# Patient Record
Sex: Female | Born: 1955 | Race: Black or African American | Hispanic: No | Smoking: Current every day smoker
Health system: Southern US, Community
[De-identification: ages and names within clinical notes are randomized; demographics above are authoritative.]

## PROBLEM LIST (undated history)

## (undated) DIAGNOSIS — I10 Essential (primary) hypertension: Secondary | ICD-10-CM

## (undated) DIAGNOSIS — M1990 Unspecified osteoarthritis, unspecified site: Secondary | ICD-10-CM

## (undated) DIAGNOSIS — M797 Fibromyalgia: Secondary | ICD-10-CM

## (undated) HISTORY — DX: Essential (primary) hypertension: I10

## (undated) HISTORY — PX: TUBAL LIGATION: SHX77

---

## 2001-08-14 ENCOUNTER — Encounter: Payer: Self-pay | Admitting: Internal Medicine

## 2001-08-14 ENCOUNTER — Ambulatory Visit (HOSPITAL_COMMUNITY): Admission: RE | Admit: 2001-08-14 | Discharge: 2001-08-14 | Payer: Self-pay | Admitting: Internal Medicine

## 2001-09-21 ENCOUNTER — Emergency Department (HOSPITAL_COMMUNITY): Admission: EM | Admit: 2001-09-21 | Discharge: 2001-09-21 | Payer: Self-pay | Admitting: Emergency Medicine

## 2001-09-21 ENCOUNTER — Encounter: Payer: Self-pay | Admitting: Emergency Medicine

## 2001-09-24 ENCOUNTER — Ambulatory Visit (HOSPITAL_COMMUNITY): Admission: RE | Admit: 2001-09-24 | Discharge: 2001-09-24 | Payer: Self-pay | Admitting: Internal Medicine

## 2001-09-24 ENCOUNTER — Encounter: Payer: Self-pay | Admitting: Internal Medicine

## 2002-11-07 ENCOUNTER — Encounter: Payer: Self-pay | Admitting: Emergency Medicine

## 2002-11-07 ENCOUNTER — Emergency Department (HOSPITAL_COMMUNITY): Admission: EM | Admit: 2002-11-07 | Discharge: 2002-11-07 | Payer: Self-pay | Admitting: Emergency Medicine

## 2003-08-11 ENCOUNTER — Emergency Department (HOSPITAL_COMMUNITY): Admission: EM | Admit: 2003-08-11 | Discharge: 2003-08-12 | Payer: Self-pay | Admitting: Emergency Medicine

## 2006-08-04 ENCOUNTER — Emergency Department (HOSPITAL_COMMUNITY): Admission: EM | Admit: 2006-08-04 | Discharge: 2006-08-04 | Payer: Self-pay | Admitting: Emergency Medicine

## 2008-11-21 ENCOUNTER — Emergency Department (HOSPITAL_COMMUNITY): Admission: EM | Admit: 2008-11-21 | Discharge: 2008-11-21 | Payer: Self-pay | Admitting: Emergency Medicine

## 2009-02-03 ENCOUNTER — Emergency Department (HOSPITAL_COMMUNITY): Admission: EM | Admit: 2009-02-03 | Discharge: 2009-02-03 | Payer: Self-pay | Admitting: Emergency Medicine

## 2009-07-01 ENCOUNTER — Emergency Department (HOSPITAL_COMMUNITY): Admission: EM | Admit: 2009-07-01 | Discharge: 2009-07-01 | Payer: Self-pay | Admitting: Emergency Medicine

## 2010-05-26 ENCOUNTER — Emergency Department (HOSPITAL_COMMUNITY): Admission: EM | Admit: 2010-05-26 | Discharge: 2010-05-27 | Payer: Self-pay | Admitting: Emergency Medicine

## 2010-06-08 ENCOUNTER — Emergency Department (HOSPITAL_COMMUNITY): Admission: EM | Admit: 2010-06-08 | Discharge: 2010-06-09 | Payer: Self-pay | Admitting: Emergency Medicine

## 2011-03-04 LAB — CBC
Hemoglobin: 13.3 g/dL (ref 12.0–15.0)
MCH: 30.3 pg (ref 26.0–34.0)
MCHC: 33.7 g/dL (ref 30.0–36.0)
MCV: 89.9 fL (ref 78.0–100.0)
Platelets: 232 10*3/uL (ref 150–400)
RBC: 4.41 MIL/uL (ref 3.87–5.11)
RDW: 14.3 % (ref 11.5–15.5)

## 2011-03-04 LAB — DIFFERENTIAL
Basophils Absolute: 0.1 10*3/uL (ref 0.0–0.1)
Basophils Relative: 0 % (ref 0–1)
Eosinophils Relative: 2 % (ref 0–5)
Lymphocytes Relative: 13 % (ref 12–46)
Monocytes Absolute: 0.7 10*3/uL (ref 0.1–1.0)
Neutrophils Relative %: 80 % — ABNORMAL HIGH (ref 43–77)

## 2011-03-04 LAB — BASIC METABOLIC PANEL
CO2: 25 mEq/L (ref 19–32)
Creatinine, Ser: 0.64 mg/dL (ref 0.4–1.2)
GFR calc Af Amer: >60 mL/min (ref >60)
Glucose, Bld: 111 mg/dL — ABNORMAL HIGH (ref 70–99)
Potassium: 3.8 mEq/L (ref 3.5–5.1)
Sodium: 139 mEq/L (ref 135–145)

## 2011-03-04 LAB — URINALYSIS, ROUTINE W REFLEX MICROSCOPIC
Glucose, UA: NEGATIVE mg/dL
Nitrite: NEGATIVE
Specific Gravity, Urine: 1.01 (ref 1.005–1.030)

## 2011-03-04 LAB — GLUCOSE, CAPILLARY: Glucose-Capillary: 107 mg/dL — ABNORMAL HIGH (ref 70–99)

## 2011-03-04 LAB — URINE MICROSCOPIC-ADD ON

## 2011-03-16 ENCOUNTER — Emergency Department (HOSPITAL_COMMUNITY): Payer: No Typology Code available for payment source

## 2011-03-16 ENCOUNTER — Emergency Department (HOSPITAL_COMMUNITY)
Admission: EM | Admit: 2011-03-16 | Discharge: 2011-03-16 | Disposition: A | Discharge status: Home / Self Care | Payer: No Typology Code available for payment source | Attending: Emergency Medicine | Admitting: Emergency Medicine

## 2011-03-16 DIAGNOSIS — T07XXXA Unspecified multiple injuries, initial encounter: Secondary | ICD-10-CM | POA: Insufficient documentation

## 2011-03-16 DIAGNOSIS — Y92410 Unspecified street and highway as the place of occurrence of the external cause: Secondary | ICD-10-CM | POA: Insufficient documentation

## 2011-03-16 LAB — BASIC METABOLIC PANEL
BUN: 14 mg/dL (ref 6–23)
CO2: 24 mEq/L (ref 19–32)
GFR calc Af Amer: >60 mL/min (ref >60)
GFR calc non Af Amer: >60 mL/min (ref >60)
Glucose, Bld: 98 mg/dL (ref 70–99)
Potassium: 4.3 mEq/L (ref 3.5–5.1)

## 2011-03-16 LAB — URINALYSIS, ROUTINE W REFLEX MICROSCOPIC
Ketones, ur: NEGATIVE mg/dL
Leukocytes, UA: NEGATIVE
Nitrite: NEGATIVE
Urobilinogen, UA: 0.2 mg/dL (ref 0.0–1.0)

## 2011-03-16 LAB — URINE MICROSCOPIC-ADD ON

## 2011-03-16 MED ORDER — IOHEXOL 300 MG/ML  SOLN
100.0000 mL | Freq: Once | INTRAMUSCULAR | Status: AC | PRN
  Administered 2011-03-16: 100 mL via INTRAVENOUS

## 2011-03-23 ENCOUNTER — Emergency Department (HOSPITAL_COMMUNITY)
Admission: EM | Admit: 2011-03-23 | Discharge: 2011-03-23 | Disposition: A | Discharge status: Home / Self Care | Payer: No Typology Code available for payment source | Attending: Emergency Medicine | Admitting: Emergency Medicine

## 2011-03-23 DIAGNOSIS — M545 Low back pain, unspecified: Secondary | ICD-10-CM | POA: Insufficient documentation

## 2011-03-23 DIAGNOSIS — M546 Pain in thoracic spine: Secondary | ICD-10-CM | POA: Insufficient documentation

## 2011-03-23 DIAGNOSIS — M542 Cervicalgia: Secondary | ICD-10-CM | POA: Insufficient documentation

## 2011-03-23 DIAGNOSIS — S335XXA Sprain of ligaments of lumbar spine, initial encounter: Secondary | ICD-10-CM | POA: Insufficient documentation

## 2011-03-25 LAB — POCT I-STAT, CHEM 8
Creatinine, Ser: 0.9 mg/dL (ref 0.4–1.2)
Glucose, Bld: 99 mg/dL (ref 70–99)
Sodium: 141 mEq/L (ref 135–145)
TCO2: 27 mmol/L (ref 0–100)

## 2011-03-25 LAB — CBC
Hemoglobin: 13.9 g/dL (ref 12.0–15.0)
Platelets: 273 10*3/uL (ref 150–400)
WBC: 12.5 10*3/uL — ABNORMAL HIGH (ref 4.0–10.5)

## 2011-04-03 LAB — URINALYSIS, ROUTINE W REFLEX MICROSCOPIC
Glucose, UA: NEGATIVE mg/dL
Ketones, ur: NEGATIVE mg/dL
Leukocytes, UA: NEGATIVE
Nitrite: NEGATIVE
Protein, ur: NEGATIVE mg/dL
pH: 6 (ref 5.0–8.0)

## 2011-04-03 LAB — URINE MICROSCOPIC-ADD ON

## 2011-04-03 LAB — DIFFERENTIAL
Eosinophils Relative: 2 % (ref 0–5)
Monocytes Absolute: 0.6 10*3/uL (ref 0.1–1.0)

## 2011-04-03 LAB — CBC
MCHC: 33.1 g/dL (ref 30.0–36.0)
Platelets: 271 10*3/uL (ref 150–400)
RDW: 14.1 % (ref 11.5–15.5)
WBC: 11.3 10*3/uL — ABNORMAL HIGH (ref 4.0–10.5)

## 2011-04-03 LAB — BASIC METABOLIC PANEL
BUN: 19 mg/dL (ref 6–23)
CO2: 29 mEq/L (ref 19–32)
Calcium: 9.4 mg/dL (ref 8.4–10.5)
Creatinine, Ser: 0.78 mg/dL (ref 0.4–1.2)

## 2011-09-21 LAB — URINALYSIS, ROUTINE W REFLEX MICROSCOPIC
Glucose, UA: NEGATIVE mg/dL
Leukocytes, UA: NEGATIVE
pH: 5.5 (ref 5.0–8.0)

## 2011-09-21 LAB — BASIC METABOLIC PANEL
GFR calc non Af Amer: >60 mL/min (ref >60)
Potassium: 2.9 mEq/L — ABNORMAL LOW (ref 3.5–5.1)
Sodium: 140 mEq/L (ref 135–145)

## 2011-09-21 LAB — URINE MICROSCOPIC-ADD ON

## 2011-09-21 LAB — CBC
HCT: 40.9 % (ref 36.0–46.0)
Hemoglobin: 13.6 g/dL (ref 12.0–15.0)
MCV: 90.2 fL (ref 78.0–100.0)
RBC: 4.53 MIL/uL (ref 3.87–5.11)

## 2011-09-21 LAB — DIFFERENTIAL
Eosinophils Relative: 0 % (ref 0–5)
Lymphocytes Relative: 9 % — ABNORMAL LOW (ref 12–46)
Lymphs Abs: 1.3 10*3/uL (ref 0.7–4.0)
Monocytes Absolute: 0.2 10*3/uL (ref 0.1–1.0)
Monocytes Relative: 1 % — ABNORMAL LOW (ref 3–12)

## 2011-09-21 LAB — GLUCOSE, CAPILLARY: Glucose-Capillary: 133 mg/dL — ABNORMAL HIGH (ref 70–99)

## 2011-09-21 LAB — POCT CARDIAC MARKERS
CKMB, poc: 1.2 ng/mL (ref 1.0–8.0)
Myoglobin, poc: 36.5 ng/mL (ref 12–200)

## 2011-09-21 LAB — RAPID URINE DRUG SCREEN, HOSP PERFORMED
Barbiturates: NOT DETECTED
Benzodiazepines: NOT DETECTED

## 2011-09-21 LAB — ETHANOL: Alcohol, Ethyl (B): <5 mg/dL (ref 0–10)

## 2012-07-23 ENCOUNTER — Emergency Department (HOSPITAL_COMMUNITY)
Admission: EM | Admit: 2012-07-23 | Discharge: 2012-07-23 | Disposition: A | Discharge status: Home / Self Care | Payer: Self-pay | Attending: Emergency Medicine | Admitting: Emergency Medicine

## 2012-07-23 ENCOUNTER — Encounter (HOSPITAL_COMMUNITY): Payer: Self-pay

## 2012-07-23 DIAGNOSIS — R51 Headache: Secondary | ICD-10-CM | POA: Insufficient documentation

## 2012-07-23 DIAGNOSIS — H6002 Abscess of left external ear: Secondary | ICD-10-CM

## 2012-07-23 DIAGNOSIS — H60399 Other infective otitis externa, unspecified ear: Secondary | ICD-10-CM | POA: Insufficient documentation

## 2012-07-23 DIAGNOSIS — F17200 Nicotine dependence, unspecified, uncomplicated: Secondary | ICD-10-CM | POA: Insufficient documentation

## 2012-07-23 MED ORDER — NAPROXEN 500 MG PO TABS: 500.0000 mg | ORAL_TABLET | Freq: Two times a day (BID) | ORAL | Status: AC

## 2012-07-23 MED ORDER — LIDOCAINE HCL (PF) 1 % IJ SOLN
INTRAMUSCULAR | Status: AC
  Administered 2012-07-23: 02:00:00
  Filled 2012-07-23: qty 5

## 2012-07-23 NOTE — ED Provider Notes (Signed)
History     CSN: [id number]  Arrival date & time 07/23/12  0144   First MD Initiated Contact with Patient 07/23/12 [phone]      Chief Complaint  Patient presents with  . Headache  . Recurrent Skin Infections    (Consider location/radiation/quality/duration/timing/severity/associated sxs/prior treatment) HPI Comments: 56 year old female who has a history of recurrent abscess in her left earlobe after getting an infection from her earrings. She states this is intermittent, often will go away by itself over the last several days has gotten worse and is becoming painful and causing a left-sided headache. Worse with palpation, not associated with fevers chills nausea or vomiting.  Patient is a 56 y.o. female presenting with headaches. The history is provided by the patient.  Headache  Pertinent negatives include no fever.    History reviewed. No pertinent past medical history.  History reviewed. No pertinent past surgical history.  No family history on file.  History  Substance Use Topics  . Smoking status: Current Everyday Smoker  . Smokeless tobacco: Not on file  . Alcohol Use: No    OB History    Grav Para Term Preterm Abortions TAB SAB Ect Mult Living                  Review of Systems  Constitutional: Negative for fever.  Skin: Positive for rash.  Neurological: Positive for headaches.    Allergies  Aspirin  Home Medications   Current Outpatient Rx  Name Route Sig Dispense Refill  . NAPROXEN 500 MG PO TABS Oral Take 1 tablet (500 mg total) by mouth 2 (two) times daily with a meal. 30 tablet 0    BP 164/104  Pulse 96  Temp 97.5 F (36.4 C)  Resp 16  Ht 5\' 4"  (1.626 m)  Wt 140 lb (63.504 kg)  BMI 24.03 kg/m2  SpO2 100%  Physical Exam  Nursing note and vitals reviewed. Constitutional: She appears well-developed and well-nourished. No distress.  HENT:  Head: Normocephalic and atraumatic.       Left earlobe with abscess, see skin section  Eyes:  Conjunctivae are normal. Right eye exhibits no discharge. Left eye exhibits no discharge. No scleral icterus.  Neck:       No lymphadenopathy of the neck  Cardiovascular: Normal rate and regular rhythm.   No murmur heard. Pulmonary/Chest: Effort normal and breath sounds normal.  Neurological: She is alert. Coordination normal.       No tenderness to the left side of the head, normal speech, normal coordination, normal gait.  Skin: Skin is warm and dry. She is not diaphoretic.       0.75 cm abscess to the left earlobe around the earring hole. Tender, fluctuant, no surrounding erythema    ED Course  Procedures (including critical care time)  Labs Reviewed - No data to display No results found.   1. Abscess of left external ear   2. Headache       MDM  Abscess drained, patient declines medicines for headache and has a normal neurologic exam, encouraged to perform warm soaks, anti-inflammatories, no indication for antibiotics.  INCISION AND DRAINAGE Performed by: Eber Hong D Consent: Verbal consent obtained. Risks and benefits: risks, benefits and alternatives were discussed Type: abscess  Body area: Left earlobe  Anesthesia: local infiltration  Local anesthetic: lidocaine 1 % without epinephrine  Anesthetic total: 0.5 ml  Complexity: Simple   Drainage: purulent  Drainage amount: Small   Packing material: None   Patient tolerance:  Patient tolerated the procedure well with no immediate complications.   Discharge Prescriptions include:  Naprosyn   Vida Roller, MD 07/23/12 [phone]

## 2012-07-23 NOTE — ED Notes (Signed)
Pt states she has a boil on the left ear and a headache, has been unable to sleep

## 2013-09-28 ENCOUNTER — Encounter (HOSPITAL_COMMUNITY): Payer: Self-pay | Admitting: Emergency Medicine

## 2013-09-28 ENCOUNTER — Emergency Department (HOSPITAL_COMMUNITY)
Admission: EM | Admit: 2013-09-28 | Discharge: 2013-09-28 | Disposition: A | Discharge status: Home / Self Care | Payer: Self-pay | Attending: Emergency Medicine | Admitting: Emergency Medicine

## 2013-09-28 DIAGNOSIS — M545 Low back pain, unspecified: Secondary | ICD-10-CM | POA: Insufficient documentation

## 2013-09-28 DIAGNOSIS — R109 Unspecified abdominal pain: Secondary | ICD-10-CM | POA: Insufficient documentation

## 2013-09-28 DIAGNOSIS — M79609 Pain in unspecified limb: Secondary | ICD-10-CM | POA: Insufficient documentation

## 2013-09-28 DIAGNOSIS — Z791 Long term (current) use of non-steroidal anti-inflammatories (NSAID): Secondary | ICD-10-CM | POA: Insufficient documentation

## 2013-09-28 DIAGNOSIS — M549 Dorsalgia, unspecified: Secondary | ICD-10-CM

## 2013-09-28 DIAGNOSIS — F17200 Nicotine dependence, unspecified, uncomplicated: Secondary | ICD-10-CM | POA: Insufficient documentation

## 2013-09-28 LAB — URINALYSIS, ROUTINE W REFLEX MICROSCOPIC
Glucose, UA: NEGATIVE mg/dL
Nitrite: NEGATIVE
Protein, ur: NEGATIVE mg/dL
Urobilinogen, UA: 0.2 mg/dL (ref 0.0–1.0)

## 2013-09-28 LAB — URINE MICROSCOPIC-ADD ON

## 2013-09-28 MED ORDER — PREDNISONE 10 MG PO TABS: 60.0000 mg | ORAL_TABLET | Freq: Every day | ORAL | Status: DC

## 2013-09-28 MED ORDER — HYDROCODONE-ACETAMINOPHEN 5-325 MG PO TABS
1.0000 | ORAL_TABLET | Freq: Once | ORAL | Status: AC
  Administered 2013-09-28: 1 via ORAL
  Filled 2013-09-28: qty 1

## 2013-09-28 MED ORDER — IBUPROFEN 800 MG PO TABS: 800.0000 mg | ORAL_TABLET | Freq: Three times a day (TID) | ORAL | Status: DC

## 2013-09-28 NOTE — ED Provider Notes (Signed)
CSN: [id number]     Arrival date & time 09/28/13  1644 History   First MD Initiated Contact with Patient 09/28/13 1657     Chief Complaint  Patient presents with  . Back Pain  . Leg Pain   (Consider location/radiation/quality/duration/timing/severity/associated sxs/prior Treatment) HPI Comments: Patient with a history of lower back pain presents today with 2 day history of back pain.  She reports pain started yesterday and "eased up" over the day, increasing this morning.  Reports Right leg pain, denies paresthesia. Reports taking Ibuprofen at home without relief.  Aggravating factors include standing, movement, and pressure to the affected area. Denies abdominal pain, bowl or bladder incontinence, dysuria, hematuria, fever or chills.    Patient is a 57 y.o. female presenting with back pain and leg pain. The history is provided by the patient.  Back Pain Associated symptoms: leg pain   Leg Pain Associated symptoms: back pain     History reviewed. No pertinent past medical history. History reviewed. No pertinent past surgical history. History reviewed. No pertinent family history. History  Substance Use Topics  . Smoking status: Current Every Day Smoker  . Smokeless tobacco: Not on file  . Alcohol Use: No   OB History   Grav Para Term Preterm Abortions TAB SAB Ect Mult Living                 Review of Systems  Musculoskeletal: Positive for back pain.  All other systems reviewed and are negative.    Allergies  Aspirin  Home Medications   Current Outpatient Rx  Name  Route  Sig  Dispense  Refill  . ibuprofen (ADVIL,MOTRIN) 200 MG tablet   Oral   Take 200 mg by mouth every 6 (six) hours as needed for pain.         Marland Kitchen ibuprofen (ADVIL,MOTRIN) 800 MG tablet   Oral   Take 1 tablet (800 mg total) by mouth 3 (three) times daily with meals.   21 tablet   0   . predniSONE (DELTASONE) 10 MG tablet   Oral   Take 6 tablets (60 mg total) by mouth daily. Take 6 tablets day  1 and day 2 Take 4 tablets day 3 and day 4 Take 2 tablets day 5 and day 6 Take 1 tablet day 7 and day 8   26 tablet   0    BP 138/91  Pulse 73  Temp(Src) 97.5 F (36.4 C) (Oral)  Resp 18  SpO2 98% Physical Exam  Nursing note and vitals reviewed. Constitutional: She appears well-developed and well-nourished.  HENT:  Head: Normocephalic and atraumatic.  Eyes: No scleral icterus.  Neck: Neck supple.  Cardiovascular: Normal rate, regular rhythm and normal heart sounds.   Pulmonary/Chest: Effort normal and breath sounds normal. She has no wheezes.  Abdominal: Soft. Normal appearance and bowel sounds are normal. There is tenderness in the suprapubic area. There is no rigidity, no rebound and no guarding.  Musculoskeletal:       Lumbar back: She exhibits tenderness. She exhibits no swelling and no edema.       Back:  Pain to palpation over Left SI joint.  R leg pain recreated by pressure at the SI joint.  Neurological: She is alert. She has normal strength.  Reflex Scores:      Patellar reflexes are 1+ on the right side and 1+ on the left side. Skin: Skin is warm and dry.    ED Course  Procedures (including critical care  time) Labs Review Labs Reviewed  URINALYSIS, ROUTINE W REFLEX MICROSCOPIC - Abnormal; Notable for the following:    APPearance HAZY (*)    Hgb urine dipstick SMALL (*)    Leukocytes, UA TRACE (*)    All other components within normal limits  URINE MICROSCOPIC-ADD ON - Abnormal; Notable for the following:    Squamous Epithelial / LPF MANY (*)    Bacteria, UA FEW (*)    All other components within normal limits   Imaging Review No results found.  EKG Interpretation   None       MDM   1. Back pain     Patient presents with Lower back pain, Physical exam reveals pain to palpation on L SI joint and shooting pain down LLE.  Mild suprapubic pain to palpation, will order UA to evaluate possible UTI.  BP high will recheck.  UA- not a clean specimen.   Will discharge with 800 mg Ibuprofen and prednisone taper for inflamation and pain.  Discussed treatment plan with patient.    Clabe Seal, PA-C 09/28/13 [phone]

## 2013-09-28 NOTE — ED Notes (Signed)
Pt c/o left sided lower back pain with radiation down left leg; pt sts hx of same in past x 1; pt denies obvious injury

## 2013-09-29 NOTE — ED Provider Notes (Signed)
Medical screening examination/treatment/procedure(s) were performed by non-physician practitioner and as supervising physician I was immediately available for consultation/collaboration.   Junius Argyle, MD 09/29/13 [phone]

## 2013-10-08 ENCOUNTER — Emergency Department (HOSPITAL_COMMUNITY)
Admission: EM | Admit: 2013-10-08 | Discharge: 2013-10-09 | Disposition: A | Discharge status: Home / Self Care | Payer: Self-pay | Attending: Emergency Medicine | Admitting: Emergency Medicine

## 2013-10-08 ENCOUNTER — Encounter (HOSPITAL_COMMUNITY): Payer: Self-pay | Admitting: Emergency Medicine

## 2013-10-08 DIAGNOSIS — M5432 Sciatica, left side: Secondary | ICD-10-CM

## 2013-10-08 DIAGNOSIS — M5430 Sciatica, unspecified side: Secondary | ICD-10-CM | POA: Insufficient documentation

## 2013-10-08 DIAGNOSIS — R209 Unspecified disturbances of skin sensation: Secondary | ICD-10-CM | POA: Insufficient documentation

## 2013-10-08 DIAGNOSIS — F17200 Nicotine dependence, unspecified, uncomplicated: Secondary | ICD-10-CM | POA: Insufficient documentation

## 2013-10-08 MED ORDER — CYCLOBENZAPRINE HCL 10 MG PO TABS
10.0000 mg | ORAL_TABLET | Freq: Once | ORAL | Status: AC
  Administered 2013-10-08: 10 mg via ORAL
  Filled 2013-10-08: qty 1

## 2013-10-08 MED ORDER — HYDROCODONE-ACETAMINOPHEN 5-325 MG PO TABS
1.0000 | ORAL_TABLET | Freq: Once | ORAL | Status: AC
  Administered 2013-10-08: 1 via ORAL
  Filled 2013-10-08: qty 1

## 2013-10-08 NOTE — ED Notes (Signed)
Patient complaining of shooting pain down left leg. States recently seen in ED for same.

## 2013-10-09 MED ORDER — HYDROCODONE-ACETAMINOPHEN 5-325 MG PO TABS: ORAL_TABLET | ORAL | Status: DC

## 2013-10-09 MED ORDER — CYCLOBENZAPRINE HCL 10 MG PO TABS: 10.0000 mg | ORAL_TABLET | Freq: Three times a day (TID) | ORAL | Status: DC | PRN

## 2013-10-09 NOTE — ED Provider Notes (Signed)
Medical screening examination/treatment/procedure(s) were performed by non-physician practitioner and as supervising physician I was immediately available for consultation/collaboration.     Dione Booze, MD 10/09/13 [phone]

## 2013-10-09 NOTE — ED Provider Notes (Signed)
CSN: [id number]     Arrival date & time 10/08/13  2247 History   First MD Initiated Contact with Patient 10/08/13 2258     Chief Complaint  Patient presents with  . Leg Pain   (Consider location/radiation/quality/duration/timing/severity/associated sxs/prior Treatment) HPI Comments: Lauren Colon is a 57 y.o. female who presents to the Emergency Department complaining of left lateral leg pain and buttock pain for 2 weeks.  She states that she was seen last week at [facility] for same and has continued pain.  She was given ibuprofen and prednisone which she states "help just a little".  She states pain is worse with movement.  She denies abdominal pain, pelvic pain, dysuria,  fever, vomiting, incontinence of bladder or bowel, or saddle anesthesia.    Patient is a 57 y.o. female presenting with leg pain. The history is provided by the patient.  Leg Pain Location:  Leg Time since incident:  2 weeks Injury: no   Leg location:  L upper leg and L lower leg Pain details:    Quality:  Burning, sharp and shooting   Radiates to:  Back   Severity:  Moderate   Onset quality:  Gradual   Timing:  Constant   Progression:  Unchanged Chronicity:  Recurrent Dislocation: no   Prior injury to area:  No Relieved by:  Nothing Worsened by:  Activity and bearing weight Ineffective treatments:  NSAIDs Associated symptoms: back pain and tingling   Associated symptoms: no decreased ROM, no fever, no muscle weakness, no neck pain, no numbness, no stiffness and no swelling     History reviewed. No pertinent past medical history. History reviewed. No pertinent past surgical history. History reviewed. No pertinent family history. History  Substance Use Topics  . Smoking status: Current Every Day Smoker  . Smokeless tobacco: Not on file  . Alcohol Use: No   OB History   Grav Para Term Preterm Abortions TAB SAB Ect Mult Living                 Review of Systems  Constitutional: Negative for fever.    Respiratory: Negative for shortness of breath.   Gastrointestinal: Negative for vomiting, abdominal pain and constipation.  Genitourinary: Negative for dysuria, hematuria, flank pain, decreased urine volume, vaginal bleeding, vaginal discharge, difficulty urinating and pelvic pain.       No perineal numbness or incontinence of urine or feces  Musculoskeletal: Positive for back pain and myalgias. Negative for gait problem, joint swelling, neck pain and stiffness.  Skin: Negative for rash.  Neurological: Negative for weakness and numbness.  All other systems reviewed and are negative.    Allergies  Aspirin  Home Medications   Current Outpatient Rx  Name  Route  Sig  Dispense  Refill  . cyclobenzaprine (FLEXERIL) 10 MG tablet   Oral   Take 1 tablet (10 mg total) by mouth 3 (three) times daily as needed.   21 tablet   0   . HYDROcodone-acetaminophen (NORCO/VICODIN) 5-325 MG per tablet      Take one-two tabs po q 4-6 hrs prn pain   15 tablet   0   . ibuprofen (ADVIL,MOTRIN) 200 MG tablet   Oral   Take 200 mg by mouth every 6 (six) hours as needed for pain.         Marland Kitchen ibuprofen (ADVIL,MOTRIN) 800 MG tablet   Oral   Take 1 tablet (800 mg total) by mouth 3 (three) times daily with meals.   21  tablet   0   . predniSONE (DELTASONE) 10 MG tablet   Oral   Take 6 tablets (60 mg total) by mouth daily. Take 6 tablets day 1 and day 2 Take 4 tablets day 3 and day 4 Take 2 tablets day 5 and day 6 Take 1 tablet day 7 and day 8   26 tablet   0    BP 166/94  Pulse 98  Temp(Src) 98 F (36.7 C) (Oral)  Resp 18  Ht 5\' 4"  (1.626 m)  Wt 160 lb (72.576 kg)  BMI 27.45 kg/m2  SpO2 100% Physical Exam  Nursing note and vitals reviewed. Constitutional: She is oriented to person, place, and time. She appears well-developed and well-nourished. No distress.  HENT:  Head: Normocephalic and atraumatic.  Neck: Normal range of motion. Neck supple.  Cardiovascular: Normal rate, regular  rhythm, normal heart sounds and intact distal pulses.   No murmur heard. Pulmonary/Chest: Effort normal and breath sounds normal. No respiratory distress.  Abdominal: Soft. She exhibits no distension. There is no tenderness.  Musculoskeletal: She exhibits tenderness. She exhibits no edema.       Lumbar back: She exhibits tenderness and pain. She exhibits normal range of motion, no swelling, no deformity, no laceration and normal pulse.       Back:  ttp of the left SI joint and left lumbar paraspinal muscles.  No spinal tenderness.  DP pulses are brisk and symmetrical.  Distal sensation intact.  Hip Flexors/Extensors are intact  Neurological: She is alert and oriented to person, place, and time. She has normal strength. No sensory deficit. She exhibits normal muscle tone. Coordination and gait normal.  Reflex Scores:      Patellar reflexes are 2+ on the right side and 2+ on the left side.      Achilles reflexes are 2+ on the right side and 2+ on the left side. Skin: Skin is warm and dry. No rash noted.    ED Course  Procedures (including critical care time) Labs Review Labs Reviewed - No data to display Imaging Review No results found.  EKG Interpretation   None       MDM   1. Sciatica of left side    Previous ED chart reviewed  Pt having continued SI joint pain with severe ttp over the left SI joint space and radicular pain to left leg.  Pt is ambulatory with a steady gait.  No focal neuro deficits on exam.  No concerning sx's for emergent neurological or infectious process.    Hydrocodone #15 and flexeril prescribed.  Pt is otherwise well appearing and stable for d/c.  She agrees to care plan and verbalized understanding for f/u.  Referral info given for [facility].    Manuela Halbur L. Trisha Mangle, PA-C 10/09/13 1610

## 2014-01-19 ENCOUNTER — Encounter: Payer: Self-pay | Admitting: Family Medicine

## 2014-01-19 ENCOUNTER — Ambulatory Visit (INDEPENDENT_AMBULATORY_CARE_PROVIDER_SITE_OTHER): Payer: BC Managed Care – PPO | Admitting: Family Medicine

## 2014-01-19 VITALS — BP 156/88 | HR 92 | Temp 98.0°F | Resp 20 | Ht 62.5 in | Wt 159.2 lb

## 2014-01-19 DIAGNOSIS — M79606 Pain in leg, unspecified: Secondary | ICD-10-CM

## 2014-01-19 DIAGNOSIS — Z1211 Encounter for screening for malignant neoplasm of colon: Secondary | ICD-10-CM

## 2014-01-19 DIAGNOSIS — I10 Essential (primary) hypertension: Secondary | ICD-10-CM

## 2014-01-19 DIAGNOSIS — M79609 Pain in unspecified limb: Secondary | ICD-10-CM

## 2014-01-19 DIAGNOSIS — N63 Unspecified lump in unspecified breast: Secondary | ICD-10-CM

## 2014-01-19 DIAGNOSIS — Z0000 Encounter for general adult medical examination without abnormal findings: Secondary | ICD-10-CM

## 2014-01-19 LAB — CBC WITH DIFFERENTIAL/PLATELET
Basophils Absolute: 0 10*3/uL (ref 0.0–0.1)
Basophils Relative: 0 % (ref 0–1)
EOS ABS: 0.3 10*3/uL (ref 0.0–0.7)
Eosinophils Relative: 3 % (ref 0–5)
HCT: 42.1 % (ref 36.0–46.0)
HEMOGLOBIN: 14.1 g/dL (ref 12.0–15.0)
LYMPHS ABS: 3.1 10*3/uL (ref 0.7–4.0)
LYMPHS PCT: 29 % (ref 12–46)
MCH: 29.4 pg (ref 26.0–34.0)
MCHC: 33.5 g/dL (ref 30.0–36.0)
MCV: 87.9 fL (ref 78.0–100.0)
MONOS PCT: 5 % (ref 3–12)
Monocytes Absolute: 0.5 10*3/uL (ref 0.1–1.0)
NEUTROS ABS: 6.7 10*3/uL (ref 1.7–7.7)
NEUTROS PCT: 63 % (ref 43–77)
PLATELETS: 150 10*3/uL (ref 150–400)
RBC: 4.79 MIL/uL (ref 3.87–5.11)
RDW: 14.3 % (ref 11.5–15.5)
WBC: 10.6 10*3/uL — AB (ref 4.0–10.5)

## 2014-01-19 MED ORDER — LISINOPRIL 5 MG PO TABS: 5.0000 mg | ORAL_TABLET | Freq: Every day | ORAL | Status: DC

## 2014-01-19 NOTE — Patient Instructions (Signed)

## 2014-01-20 LAB — LDL CHOLESTEROL, DIRECT: LDL DIRECT: 112 mg/dL — AB

## 2014-01-20 LAB — COMPREHENSIVE METABOLIC PANEL
ALBUMIN: 4.4 g/dL (ref 3.5–5.2)
ALT: 12 U/L (ref 0–35)
AST: 17 U/L (ref 0–37)
Alkaline Phosphatase: 80 U/L (ref 39–117)
BILIRUBIN TOTAL: 0.3 mg/dL (ref 0.2–1.2)
BUN: 13 mg/dL (ref 6–23)
CHLORIDE: 107 meq/L (ref 96–112)
CO2: 24 meq/L (ref 19–32)
Calcium: 9.2 mg/dL (ref 8.4–10.5)
Creat: 0.59 mg/dL (ref 0.50–1.10)
Glucose, Bld: 90 mg/dL (ref 70–99)
POTASSIUM: 3.9 meq/L (ref 3.5–5.3)
SODIUM: 140 meq/L (ref 135–145)
TOTAL PROTEIN: 6.9 g/dL (ref 6.0–8.3)

## 2014-01-20 LAB — TSH: TSH: 0.529 u[IU]/mL (ref 0.350–4.500)

## 2014-01-20 LAB — VITAMIN D 25 HYDROXY (VIT D DEFICIENCY, FRACTURES): VIT D 25 HYDROXY: 28 ng/mL — AB (ref 30–89)

## 2014-01-20 LAB — MICROALBUMIN, URINE: Microalb, Ur: 1.17 mg/dL (ref 0.00–1.89)

## 2014-01-20 LAB — HEMOGLOBIN A1C
HEMOGLOBIN A1C: 5.6 % (ref <5.7)
Mean Plasma Glucose: 114 mg/dL (ref <117)

## 2014-01-20 LAB — T4, FREE: Free T4: 1.31 ng/dL (ref 0.80–1.80)

## 2014-01-21 ENCOUNTER — Telehealth: Payer: Self-pay | Admitting: *Deleted

## 2014-01-21 NOTE — Telephone Encounter (Signed)
Message copied by [facility] SystemMCDANIEL, Bonnell PublicAPRIL J on Thu Jan 21, 2014  2:39 PM ------      Message from: Acey LavWOOD, ALLISON L      Created: Thu Jan 21, 2014  9:11 AM       Please let pt know all labs wnl except vit D which is a bit low. I'df like her to start taking otc vit D or calcium with D [phone] IU/day). We will discuss all results in detail at f/u visit later this month. Thanks AW ------

## 2014-01-21 NOTE — Telephone Encounter (Signed)
Pt returned nurse's call and was informed of results and that she needed to take a supplement of vit D or calcium and vit D. Pt understanding and appreciative.

## 2014-01-21 NOTE — Telephone Encounter (Signed)
Called pt to give results, no answer, message left for callback.

## 2014-01-21 NOTE — Telephone Encounter (Signed)
Message copied by [facility] LLCMCDANIEL, Bonnell PublicAPRIL J on Thu Jan 21, 2014 11:10 AM ------      Message from: Acey LavWOOD, ALLISON L      Created: Thu Jan 21, 2014  9:11 AM       Please let pt know all labs wnl except vit D which is a bit low. I'df like her to start taking otc vit D or calcium with D [phone] IU/day). We will discuss all results in detail at f/u visit later this month. Thanks AW ------

## 2014-01-21 NOTE — Progress Notes (Signed)
See telephone encounter.

## 2014-02-02 ENCOUNTER — Encounter: Payer: BC Managed Care – PPO | Admitting: Family Medicine

## 2014-02-10 ENCOUNTER — Encounter: Payer: Self-pay | Admitting: Family Medicine

## 2014-02-10 NOTE — Progress Notes (Signed)
Subjective:    Patient ID: Lauren Colon, female    DOB: [birth year], 58 y.o.   MRN: [id number]  HPI Pt here to establish care and for cpe. She has no records with her and apparently we have been unable to obtain any. She tells me that aside from htn for which she takes lisinopril, she has no medical problems. No headahches or chest pain. No blurry vision.   She had a mammogram "several years ago" and was told there was a mass and she should follow up on it with ultrasound but she did not out of fear.   She does c/o "jumpy" legs at night that are uncomfortable.    Review of Systems A 12 point review of systems is negative except as per hpi.        Objective:   Physical Exam Nursing note and vitals reviewed. Constitutional: She is oriented to person, place, and time. She appears well-developed and well-nourished.  HENT:  Right Ear: External ear normal.  Left Ear: External ear normal.  Nose: Nose normal.  Mouth/Throat: Oropharynx is clear and moist. No oropharyngeal exudate.  Eyes: Conjunctivae are normal. Pupils are equal, round, and reactive to light.  Neck: Normal range of motion. Neck supple. No thyromegaly present.  Cardiovascular: Normal rate, regular rhythm and normal heart sounds.   Pulmonary/Chest: Effort normal and breath sounds normal. breast exam - no masses palpated Abdominal: Soft. Bowel sounds are normal. She exhibits no distension. There is no tenderness. There is no rebound.  Lymphadenopathy:    She has no cervical adenopathy.  Neurological: She is alert and oriented to person, place, and time. She has normal reflexes.  Skin: Skin is warm and dry.  Psychiatric: She has a normal mood and affect. Her behavior is normal.         Assessment & Plan:  Glena was seen today for new patient.  Diagnoses and associated orders for this visit:  Essential hypertension, benign - lisinopril (PRINIVIL,ZESTRIL) 5 MG tablet; Take 1 tablet (5 mg total) by mouth  daily. - Comprehensive metabolic panel - Hemoglobin A1c - TSH - T4, free - LDL cholesterol, direct - POCT urinalysis dipstick - Microalbumin, urine  Breast mass - MM Digital Diagnostic Bilat; Future  Screen for colon cancer - Ambulatory referral to Gastroenterology  Leg pain - CBC with Differential  Health care maintenance - Vit D  25 hydroxy (rtn osteoporosis monitoring)   I'll see her back in a month to review results and se ehow her legs are feeling.  She declines pap today.

## 2014-03-24 ENCOUNTER — Telehealth: Payer: Self-pay

## 2014-03-24 NOTE — Telephone Encounter (Signed)
She goes to [facility]. Pt stated that she will call us when she is ready.

## 2014-03-24 NOTE — Telephone Encounter (Signed)
LMOM fo her to call back

## 2014-10-17 ENCOUNTER — Encounter (HOSPITAL_COMMUNITY): Payer: Self-pay

## 2014-10-17 ENCOUNTER — Emergency Department (HOSPITAL_COMMUNITY)
Admission: EM | Admit: 2014-10-17 | Discharge: 2014-10-18 | Disposition: A | Discharge status: Home / Self Care | Payer: BC Managed Care – PPO | Attending: Emergency Medicine | Admitting: Emergency Medicine

## 2014-10-17 DIAGNOSIS — Z720 Tobacco use: Secondary | ICD-10-CM | POA: Insufficient documentation

## 2014-10-17 DIAGNOSIS — I10 Essential (primary) hypertension: Secondary | ICD-10-CM | POA: Insufficient documentation

## 2014-10-17 DIAGNOSIS — M79604 Pain in right leg: Secondary | ICD-10-CM | POA: Diagnosis present

## 2014-10-17 DIAGNOSIS — Z79899 Other long term (current) drug therapy: Secondary | ICD-10-CM | POA: Insufficient documentation

## 2014-10-17 DIAGNOSIS — M1611 Unilateral primary osteoarthritis, right hip: Secondary | ICD-10-CM

## 2014-10-17 NOTE — ED Provider Notes (Signed)
CSN: [id number]     Arrival date & time 10/17/14  2229 History  This chart was scribed for Audree CamelScott T Jakyri Brunkhorst, MD by Haywood PaoNadim Abu Hashem, ED Scribe. The patient was seen in [facility] and the patient's care was started at 11:59 AM.     Chief Complaint  Patient presents with  . Leg Pain   Patient is a 58 y.o. female presenting with leg pain. The history is provided by the patient. No language interpreter was used.  Leg Pain Associated symptoms: back pain   Associated symptoms: no fever    HPI Comments: Lauren Colon is a 58 y.o. female who presents to the Emergency Department complaining of right leg pain. She says she was unable to turn her leg today when she tried to stand up or walk. Pt notes her leg was hurting and it was frozen in place. She has the pain everyday but the pain worsened around 9:00 PM tonight. The pain is all through her right leg and the pain is worst in her groin. She has numbness in all fingers and toes which has been an ongoing complaint for years, is worst in morning. No numbness/tingling now. . Pt has lower back pain. Pt took Advil which provided no relief.   Past Medical History  Diagnosis Date  . Hypertension    History reviewed. No pertinent past surgical history. No family history on file. History  Substance Use Topics  . Smoking status: Current Every Day Smoker  . Smokeless tobacco: Not on file  . Alcohol Use: No   OB History    No data available     Review of Systems  Constitutional: Negative for fever.  Gastrointestinal: Negative for abdominal pain.  Musculoskeletal: Positive for myalgias, back pain and gait problem.  Neurological: Negative for weakness and numbness.  All other systems reviewed and are negative.     Allergies  Aspirin  Home Medications   Prior to Admission medications   Medication Sig Start Date End Date Taking? Authorizing Provider  lisinopril (PRINIVIL,ZESTRIL) 5 MG tablet Take 1 tablet (5 mg total) by mouth daily.  01/19/14   Acey LavAllison L Wood, MD   BP 164/85 mmHg  Pulse 90  Temp(Src) 98.6 F (37 C) (Oral)  Resp 20  Ht 5\' 4"  (1.626 m)  Wt 162 lb (73.483 kg)  BMI 27.79 kg/m2  SpO2 99% Physical Exam  Constitutional: She is oriented to person, place, and time. She appears well-developed and well-nourished. No distress.  HENT:  Head: Normocephalic and atraumatic.  Eyes: No scleral icterus.  Neck: Neck supple. No thyromegaly present.  Cardiovascular: Normal rate, regular rhythm and normal heart sounds.   Pulmonary/Chest: No stridor. She has no wheezes. She has no rales. She exhibits no tenderness.  Abdominal: She exhibits no distension. There is no tenderness. There is no rebound.  Musculoskeletal:  Mild tenderness over right hip joint. Mild greater trochanter tenderness on right. No focal tenderness over major muscle groups in right leg  Neurological: She is oriented to person, place, and time. She has normal strength. She exhibits normal muscle tone. She displays no Babinski's sign on the right side. She displays no Babinski's sign on the left side.  Reflex Scores:      Patellar reflexes are 2+ on the right side and 2+ on the left side.      Achilles reflexes are 2+ on the right side and 2+ on the left side. Normal strength in bilateral lower extremities. Normal gross sensation. Normal gait.  Skin: No rash noted. No erythema.  Psychiatric: She has a normal mood and affect. Her behavior is normal.  Nursing note and vitals reviewed.   ED Course  Procedures  DIAGNOSTIC STUDIES: Oxygen Saturation is 99% on room air, normal by my interpretation.    COORDINATION OF CARE: 12:07 AM Discussed treatment plan with pt at bedside and pt agreed to plan.   Labs Review Labs Reviewed  CBC - Abnormal; Notable for the following:    WBC 12.6 (*)    All other components within normal limits  BASIC METABOLIC PANEL - Abnormal; Notable for the following:    Glucose, Bld 111 (*)    GFR calc non Af Amer 63 (*)     GFR calc Af Amer 73 (*)    All other components within normal limits  CK - Abnormal; Notable for the following:    Total CK 196 (*)    All other components within normal limits    Imaging Review Dg Hip Complete Right  10/18/2014   CLINICAL DATA:  Chronic RIGHT hip pain, worsening.  No injury.  EXAM: RIGHT HIP - COMPLETE 2+ VIEW  COMPARISON:  RIGHT femur radiographs July 01, 2009  FINDINGS: Moderate RIGHT femoral head spurring, similar superior lateral acetabular spurring. No fracture deformity or dislocation. No destructive bony lesions. Probable phleboliths projecting RIGHT pelvis. No destructive bony lesions. Periarticular soft tissue planes are nonsuspicious. Punctate calcifications projecting over the LEFT L4 transverse process are likely enteric.  IMPRESSION: Moderate RIGHT hip osteoarthrosis without fracture deformity or dislocation.   Electronically Signed   By: Awilda Metroourtnay  Bloomer   On: 10/18/2014 01:28     EKG Interpretation None      MDM   Final diagnoses:  Right leg pain  Osteoarthritis of right hip, unspecified osteoarthritis type   History, exam, and x-ray are most consistent with an acute on chronic right hip arthritis. Patient is neurovascularly intact. No concerning red flags on history or exam. This does seem to be an acute on chronic issue. At this point we'll have her continue NSAIDs and add on narcotics for acute pain relief. I discussed the importance of getting a PCP for long-term management.   I personally performed the services described in this documentation, which was scribed in my presence. The recorded information has been reviewed and is accurate.     Audree CamelScott T Shauntell Iglesia, MD 10/18/14 [phone]

## 2014-10-17 NOTE — ED Notes (Signed)
Right leg pain, hurts to stand, could not even turn my leg per pt. Also had tingling in the tips of my fingers per pt.

## 2014-10-18 ENCOUNTER — Emergency Department (HOSPITAL_COMMUNITY): Payer: BC Managed Care – PPO

## 2014-10-18 LAB — BASIC METABOLIC PANEL
Anion gap: 10 (ref 5–15)
BUN: 16 mg/dL (ref 6–23)
CALCIUM: 9.2 mg/dL (ref 8.4–10.5)
CO2: 26 mEq/L (ref 19–32)
CREATININE: 0.97 mg/dL (ref 0.50–1.10)
Chloride: 108 mEq/L (ref 96–112)
GFR calc non Af Amer: 63 mL/min — ABNORMAL LOW (ref >90)
GFR, EST AFRICAN AMERICAN: 73 mL/min — AB (ref >90)
Glucose, Bld: 111 mg/dL — ABNORMAL HIGH (ref 70–99)
Potassium: 3.8 mEq/L (ref 3.7–5.3)
Sodium: 144 mEq/L (ref 137–147)

## 2014-10-18 LAB — CBC
HEMATOCRIT: 38.6 % (ref 36.0–46.0)
Hemoglobin: 12.7 g/dL (ref 12.0–15.0)
MCH: 28.9 pg (ref 26.0–34.0)
MCHC: 32.9 g/dL (ref 30.0–36.0)
MCV: 87.7 fL (ref 78.0–100.0)
Platelets: 240 10*3/uL (ref 150–400)
RBC: 4.4 MIL/uL (ref 3.87–5.11)
RDW: 14.3 % (ref 11.5–15.5)
WBC: 12.6 10*3/uL — ABNORMAL HIGH (ref 4.0–10.5)

## 2014-10-18 LAB — CK: Total CK: 196 U/L — ABNORMAL HIGH (ref 7–177)

## 2014-10-18 MED ORDER — HYDROCODONE-ACETAMINOPHEN 5-325 MG PO TABS: 1.0000 | ORAL_TABLET | Freq: Four times a day (QID) | ORAL | Status: DC | PRN

## 2014-10-18 MED ORDER — MORPHINE SULFATE 4 MG/ML IJ SOLN
4.0000 mg | Freq: Once | INTRAMUSCULAR | Status: AC
  Administered 2014-10-18: 4 mg via INTRAVENOUS
  Filled 2014-10-18: qty 1

## 2014-10-18 NOTE — Discharge Instructions (Signed)
Hip Pain Your hip is the joint between your upper legs and your lower pelvis. The bones, cartilage, tendons, and muscles of your hip joint perform a lot of work each day supporting your body weight and allowing you to move around. Hip pain can range from a minor ache to severe pain in one or both of your hips. Pain may be felt on the inside of the hip joint near the groin, or the outside near the buttocks and upper thigh. You may have swelling or stiffness as well.  HOME CARE INSTRUCTIONS   Take medicines only as directed by your health care provider.  Apply ice to the injured area:  Put ice in a plastic bag.  Place a towel between your skin and the bag.  Leave the ice on for 15-20 minutes at a time, 3-4 times a day.  Keep your leg raised (elevated) when possible to lessen swelling.  Avoid activities that cause pain.  Follow specific exercises as directed by your health care provider.  Sleep with a pillow between your legs on your most comfortable side.  Record how often you have hip pain, the location of the pain, and what it feels like. SEEK MEDICAL CARE IF:   You are unable to put weight on your leg.  Your hip is red or swollen or very tender to touch.  Your pain or swelling continues or worsens after 1 week.  You have increasing difficulty walking.  You have a fever. SEEK IMMEDIATE MEDICAL CARE IF:   You have fallen.  You have a sudden increase in pain and swelling in your hip. MAKE SURE YOU:   Understand these instructions.  Will watch your condition.  Will get help right away if you are not doing well or get worse. Document Released: 05/23/2010 Document Revised: 04/19/2014 Document Reviewed: 07/30/2013 [facility] Patient Information 2015 [place], [place]. This information is not intended to replace advice given to you by your health care provider. Make sure you discuss any questions you have with your health care provider.    Osteoarthritis Osteoarthritis is a  disease that causes soreness and inflammation of a joint. It occurs when the cartilage at the affected joint wears down. Cartilage acts as a cushion, covering the ends of bones where they meet to form a joint. Osteoarthritis is the most common form of arthritis. It often occurs in older people. The joints affected most often by this condition include those in the:  Ends of the fingers.  Thumbs.  Neck.  Lower back.  Knees.  Hips. CAUSES  Over time, the cartilage that covers the ends of bones begins to wear away. This causes bone to rub on bone, producing pain and stiffness in the affected joints.  RISK FACTORS Certain factors can increase your chances of having osteoarthritis, including:  Older age.  Excessive body weight.  Overuse of joints.  Previous joint injury. SIGNS AND SYMPTOMS   Pain, swelling, and stiffness in the joint.  Over time, the joint may lose its normal shape.  Small deposits of bone (osteophytes) may grow on the edges of the joint.  Bits of bone or cartilage can break off and float inside the joint space. This may cause more pain and damage. DIAGNOSIS  Your health care provider will do a physical exam and ask about your symptoms. Various tests may be ordered, such as:  X-rays of the affected joint.  An MRI scan.  Blood tests to rule out other types of arthritis.  Joint fluid tests.  This involves using a needle to draw fluid from the joint and examining the fluid under a microscope. TREATMENT  Goals of treatment are to control pain and improve joint function. Treatment plans may include:  A prescribed exercise program that allows for rest and joint relief.  A weight control plan.  Pain relief techniques, such as:  Properly applied heat and cold.  Electric pulses delivered to nerve endings under the skin (transcutaneous electrical nerve stimulation [TENS]).  Massage.  Certain nutritional supplements.  Medicines to control pain, such  as:  Acetaminophen.  Nonsteroidal anti-inflammatory drugs (NSAIDs), such as naproxen.  Narcotic or central-acting agents, such as tramadol.  Corticosteroids. These can be given orally or as an injection.  Surgery to reposition the bones and relieve pain (osteotomy) or to remove loose pieces of bone and cartilage. Joint replacement may be needed in advanced states of osteoarthritis. HOME CARE INSTRUCTIONS   Take medicines only as directed by your health care provider.  Maintain a healthy weight. Follow your health care provider's instructions for weight control. This may include dietary instructions.  Exercise as directed. Your health care provider can recommend specific types of exercise. These may include:  Strengthening exercises. These are done to strengthen the muscles that support joints affected by arthritis. They can be performed with weights or with exercise bands to add resistance.  Aerobic activities. These are exercises, such as brisk walking or low-impact aerobics, that get your heart pumping.  Range-of-motion activities. These keep your joints limber.  Balance and agility exercises. These help you maintain daily living skills.  Rest your affected joints as directed by your health care provider.  Keep all follow-up visits as directed by your health care provider. SEEK MEDICAL CARE IF:   Your skin turns red.  You develop a rash in addition to your joint pain.  You have worsening joint pain.  You have a fever along with joint or muscle aches. SEEK IMMEDIATE MEDICAL CARE IF:  You have a significant loss of weight or appetite.  You have night sweats. FOR MORE INFORMATION   National Institute of Arthritis and Musculoskeletal and Skin Diseases: www.niams.http://www.myers.net/nih.gov  [employer] Aging: https://walker.com/www.nia.nih.gov  American College of Rheumatology: www.rheumatology.org Document Released: 12/03/2005 Document Revised: 04/19/2014 Document Reviewed: 08/10/2013 [facility] CountyExit[facility]  Patient Information 2015 [place]Care, [place]LLC. This information is not intended to replace advice given to you by your health care provider. Make sure you discuss any questions you have with your health care provider.

## 2015-02-04 ENCOUNTER — Encounter (HOSPITAL_COMMUNITY): Payer: Self-pay

## 2015-02-08 ENCOUNTER — Encounter (HOSPITAL_COMMUNITY): Payer: Self-pay

## 2015-02-15 ENCOUNTER — Other Ambulatory Visit (HOSPITAL_COMMUNITY): Payer: Self-pay | Admitting: Family Medicine

## 2015-02-15 ENCOUNTER — Encounter (HOSPITAL_COMMUNITY): Payer: Self-pay

## 2015-02-15 ENCOUNTER — Ambulatory Visit (HOSPITAL_COMMUNITY)
Admission: RE | Admit: 2015-02-15 | Discharge: 2015-02-15 | Disposition: A | Discharge status: Home / Self Care | Payer: 59 | Source: Ambulatory Visit | Attending: Family Medicine | Admitting: Family Medicine

## 2015-02-15 DIAGNOSIS — Z1231 Encounter for screening mammogram for malignant neoplasm of breast: Secondary | ICD-10-CM

## 2015-02-15 DIAGNOSIS — F17210 Nicotine dependence, cigarettes, uncomplicated: Secondary | ICD-10-CM

## 2015-02-15 DIAGNOSIS — R05 Cough: Secondary | ICD-10-CM | POA: Insufficient documentation

## 2015-02-15 DIAGNOSIS — R0602 Shortness of breath: Secondary | ICD-10-CM | POA: Diagnosis not present

## 2015-02-15 DIAGNOSIS — N63 Unspecified lump in unspecified breast: Secondary | ICD-10-CM

## 2015-02-24 ENCOUNTER — Ambulatory Visit (HOSPITAL_COMMUNITY)
Admission: RE | Admit: 2015-02-24 | Discharge: 2015-02-24 | Disposition: A | Discharge status: Home / Self Care | Payer: 59 | Source: Ambulatory Visit | Attending: Family Medicine | Admitting: Family Medicine

## 2015-02-24 DIAGNOSIS — Z1231 Encounter for screening mammogram for malignant neoplasm of breast: Secondary | ICD-10-CM | POA: Insufficient documentation

## 2015-07-14 ENCOUNTER — Encounter: Payer: Self-pay | Admitting: *Deleted

## 2015-07-14 ENCOUNTER — Encounter: Payer: Self-pay | Admitting: Cardiovascular Disease

## 2015-07-14 ENCOUNTER — Ambulatory Visit (INDEPENDENT_AMBULATORY_CARE_PROVIDER_SITE_OTHER): Payer: 59 | Admitting: Cardiovascular Disease

## 2015-07-14 VITALS — BP 144/92 | HR 93 | Ht 64.0 in | Wt 154.0 lb

## 2015-07-14 DIAGNOSIS — R079 Chest pain, unspecified: Secondary | ICD-10-CM | POA: Insufficient documentation

## 2015-07-14 DIAGNOSIS — R5383 Other fatigue: Secondary | ICD-10-CM

## 2015-07-14 DIAGNOSIS — R0602 Shortness of breath: Secondary | ICD-10-CM | POA: Diagnosis not present

## 2015-07-14 DIAGNOSIS — Z720 Tobacco use: Secondary | ICD-10-CM

## 2015-07-14 NOTE — Progress Notes (Signed)
Patient ID: Lauren Colon, female   DOB: 03-Nov-1956, 59 y.o.   MRN: [id number]       CARDIOLOGY CONSULT NOTE  Patient ID: Lauren Colon MRN: [id number] DOB/AGE: 03/25/1956 59 y.o.  Admit date: (Not on file) Primary Physician Alice Reichert, MD  Reason for Consultation: chest pain  HPI: The patient is a 59 yr old female who has been referred for the evaluation of chest pain. An ECG from her PCP showed normal sinus rhythm but is a poor quality tracing. Past medical history includes hypertension although she is not on antihypertensives therapy.  She has been experiencing chest pressure with exertion alleviated with rest. It is retrosternal in location. It is accompanied by shortness of breath and fatigue. She now feels that if she walks 100 yards she tires much more easily. She also has intermittent palpitations. She does say she has a lot of anxiety and stress at home. She has a daughter who is 74 years old and has been widowed for the past 7 months. The patient feels like whenever she tries to help her, her daughter creates more problems. The patient's husband is also in poor health. She has sciatic pain of her right leg alleviated with herbs.  She has 6 grandchildren whom she is concerned about which is why she seeks medical attention. She has not used any sublingual nitroglycerin yet.  Soc: Smokes 1-1.5 ppd x 43 years.  Allergies  Allergen Reactions  . Aspirin Nausea Only    Current Outpatient Prescriptions  Medication Sig Dispense Refill  . nitroGLYCERIN (NITROSTAT) 0.4 MG SL tablet Place 0.4 mg under the tongue every 5 (five) minutes as needed for chest pain.     No current facility-administered medications for this visit.    Past Medical History  Diagnosis Date  . Hypertension     No past surgical history on file.  History   Social History  . Marital Status: Single    Spouse Name: N/A  . Number of Children: N/A  . Years of Education: N/A   Occupational  History  . Not on file.   Social History Main Topics  . Smoking status: Current Every Day Smoker -- 43 years    Types: Cigarettes  . Smokeless tobacco: Never Used     Comment: started smoking at 59 y/o  . Alcohol Use: No  . Drug Use: No  . Sexual Activity: Not on file   Other Topics Concern  . Not on file   Social History Narrative     No family history of premature CAD in 1st degree relatives.  Prior to Admission medications   Medication Sig Start Date End Date Taking? Authorizing Provider  HYDROcodone-acetaminophen (NORCO/VICODIN) 5-325 MG per tablet Take 1 tablet by mouth every 6 (six) hours as needed. 10/18/14   Vanetta Mulders, MD  lisinopril (PRINIVIL,ZESTRIL) 5 MG tablet Take 1 tablet (5 mg total) by mouth daily. 01/19/14   Acey Lav, MD     Review of systems complete and found to be negative unless listed above in HPI     Physical exam Blood pressure 144/92, pulse 93, height 5\' 4"  (1.626 m), weight 154 lb (69.854 kg), SpO2 94 %. General: NAD Neck: No JVD, no thyromegaly or thyroid nodule.  Lungs: Diminished throughout but clear without rales or wheezes. CV: Nondisplaced PMI. Regular rate and rhythm, normal S1/S2, no S3/S4, no murmur.  No peripheral edema.  No carotid bruit.  Normal pedal pulses.  Abdomen: Soft, nontender, no hepatosplenomegaly, no  distention.  Skin: Intact without lesions or rashes.  Neurologic: Alert and oriented x 3.  Psych: Normal affect. Extremities: No clubbing or cyanosis.  HEENT: Normal.   ECG: Most recent ECG reviewed.  Labs:   Lab Results  Component Value Date   WBC 12.6* 10/18/2014   HGB 12.7 10/18/2014   HCT 38.6 10/18/2014   MCV 87.7 10/18/2014   PLT 240 10/18/2014   No results for input(s): NA, K, CL, CO2, BUN, CREATININE, CALCIUM, PROT, BILITOT, ALKPHOS, ALT, AST, GLUCOSE in the last 168 hours.  Invalid input(s): LABALBU Lab Results  Component Value Date   CKTOTAL 196* 10/18/2014   No results found for: CHOL No  results found for: HDL No results found for: LDLCALC No results found for: TRIG No results found for: [facility] Lab Results  Component Value Date   LDLDIRECT 112* 01/19/2014         Studies: No results found.  ASSESSMENT AND PLAN:  1. Chest pressure with fatigue and shortness of breath: Given long history of tobacco abuse with typical symptoms, ischemic heart disease should be ruled out. She is unable to walk on a treadmill due to sciatic pain. Will proceed with a Lexiscan Cardiolite stress test. If this were found to be normal, I would recommend obtaining pulmonary function testing as she likely has some degree of COPD.  2. Essential HTN: Will monitor for now. If persistently elevated, will need antihypertensive therapy.  3. Tobacco abuse disorder.  Dispo: f/u 1 month.   Signed: Prentice Docker, M.D., F.A.C.C.  07/14/2015, 12:57 PM

## 2015-07-14 NOTE — Patient Instructions (Signed)
Your physician has requested that you have a lexiscan myoview. For further information please visit [email or web address]. Please follow instruction sheet, as given. Office will contact with results via phone or letter.   Continue all current medications. Follow up in  1 month   

## 2015-07-22 ENCOUNTER — Encounter (HOSPITAL_COMMUNITY)
Admission: RE | Admit: 2015-07-22 | Discharge: 2015-07-22 | Disposition: A | Discharge status: Home / Self Care | Payer: 59 | Source: Ambulatory Visit | Attending: Cardiovascular Disease | Admitting: Cardiovascular Disease

## 2015-07-22 ENCOUNTER — Inpatient Hospital Stay (HOSPITAL_COMMUNITY): Admission: RE | Admit: 2015-07-22 | Payer: 59 | Source: Ambulatory Visit

## 2015-07-22 ENCOUNTER — Encounter (HOSPITAL_COMMUNITY): Payer: Self-pay

## 2015-07-22 DIAGNOSIS — R079 Chest pain, unspecified: Secondary | ICD-10-CM | POA: Insufficient documentation

## 2015-07-22 LAB — NM MYOCAR MULTI W/SPECT W/WALL MOTION / EF
CSEPED: 4 min
Peak HR: 104 {beats}/min
Rest HR: 68 {beats}/min

## 2015-07-22 MED ORDER — TECHNETIUM TC 99M SESTAMIBI - CARDIOLITE
30.0000 | Freq: Once | INTRAVENOUS | Status: AC | PRN
Start: 2015-07-22 — End: 2015-07-22
  Administered 2015-07-22: 30 via INTRAVENOUS

## 2015-07-22 MED ORDER — TECHNETIUM TC 99M SESTAMIBI GENERIC - CARDIOLITE
10.0000 | Freq: Once | INTRAVENOUS | Status: AC | PRN
  Administered 2015-07-22: 10 via INTRAVENOUS

## 2015-07-22 MED ORDER — SODIUM CHLORIDE 0.9 % IJ SOLN
INTRAMUSCULAR | Status: AC
  Administered 2015-07-22: 10 mL via INTRAVENOUS
  Filled 2015-07-22: qty 3

## 2015-07-22 MED ORDER — REGADENOSON 0.4 MG/5ML IV SOLN
INTRAVENOUS | Status: AC
Start: 2015-07-22 — End: 2015-07-22
  Administered 2015-07-22: 0.4 mg via INTRAVENOUS
  Filled 2015-07-22: qty 5

## 2015-07-27 ENCOUNTER — Telehealth: Payer: Self-pay | Admitting: *Deleted

## 2015-07-27 ENCOUNTER — Telehealth: Payer: Self-pay | Admitting: Cardiovascular Disease

## 2015-07-27 DIAGNOSIS — R0602 Shortness of breath: Secondary | ICD-10-CM

## 2015-07-27 NOTE — Telephone Encounter (Signed)
Checking percert for Complete PFT's scheduled 08-03-15 at [facility].

## 2015-07-27 NOTE — Telephone Encounter (Signed)
Pt aware, will orders PFT's and forward to schedulers

## 2015-07-27 NOTE — Telephone Encounter (Signed)
-----   Message from Suresh A Koneswaran, MD sent at 07/22/2015  5:31 PM EDT ----- Low risk. Will manage medically. Obtain PFT's for SOB. 

## 2015-07-27 NOTE — Telephone Encounter (Signed)
-----   Message from Laqueta Linden, MD sent at 07/22/2015  5:31 PM EDT ----- Low risk. Will manage medically. Obtain PFT's for SOB.

## 2015-07-27 NOTE — Telephone Encounter (Signed)
Pt aware of PFT date and time

## 2015-08-03 ENCOUNTER — Ambulatory Visit (HOSPITAL_COMMUNITY)
Admission: RE | Admit: 2015-08-03 | Discharge: 2015-08-03 | Disposition: A | Discharge status: Home / Self Care | Payer: 59 | Source: Ambulatory Visit | Attending: Cardiovascular Disease | Admitting: Cardiovascular Disease

## 2015-08-03 DIAGNOSIS — R0602 Shortness of breath: Secondary | ICD-10-CM | POA: Insufficient documentation

## 2015-08-03 LAB — PULMONARY FUNCTION TEST
DL/VA % pred: 94 %
DL/VA: 4.55 ml/min/mmHg/L
DLCO UNC: 18.97 ml/min/mmHg
DLCO unc % pred: 78 %
FEF 25-75 Post: 1.54 L/sec
FEF 25-75 Pre: 1.16 L/sec
FEF2575-%Change-Post: 32 %
FEF2575-%Pred-Post: 73 %
FEF2575-%Pred-Pre: 55 %
FEV1-%CHANGE-POST: 6 %
FEV1-%Pred-Post: 101 %
FEV1-%Pred-Pre: 95 %
FEV1-POST: 2.14 L
FEV1-Pre: 2 L
FEV1FVC-%Change-Post: 4 %
FEV1FVC-%Pred-Pre: 86 %
FEV6-%Change-Post: 4 %
FEV6-%Pred-Post: 114 %
FEV6-%Pred-Pre: 109 %
FEV6-POST: 2.95 L
FEV6-Pre: 2.83 L
FEV6FVC-%Change-Post: 1 %
FEV6FVC-%Pred-Post: 102 %
FEV6FVC-%Pred-Pre: 101 %
FVC-%Change-Post: 2 %
FVC-%Pred-Post: 110 %
FVC-%Pred-Pre: 107 %
FVC-Post: 2.95 L
FVC-Pre: 2.88 L
PRE FEV6/FVC RATIO: 98 %
Post FEV1/FVC ratio: 72 %
Post FEV6/FVC ratio: 100 %
Pre FEV1/FVC ratio: 70 %
RV % PRED: 128 %
RV: 2.54 L
TLC % pred: 104 %
TLC: 5.28 L

## 2015-08-03 MED ORDER — ALBUTEROL SULFATE (2.5 MG/3ML) 0.083% IN NEBU
2.5000 mg | INHALATION_SOLUTION | Freq: Once | RESPIRATORY_TRACT | Status: AC
  Administered 2015-08-03: 2.5 mg via RESPIRATORY_TRACT

## 2015-08-05 ENCOUNTER — Other Ambulatory Visit (HOSPITAL_COMMUNITY): Payer: Self-pay | Admitting: Respiratory Therapy

## 2015-08-12 ENCOUNTER — Ambulatory Visit (INDEPENDENT_AMBULATORY_CARE_PROVIDER_SITE_OTHER): Payer: 59 | Admitting: Cardiovascular Disease

## 2015-08-12 ENCOUNTER — Encounter: Payer: Self-pay | Admitting: Cardiovascular Disease

## 2015-08-12 VITALS — BP 138/84 | HR 90 | Ht 64.0 in | Wt 153.0 lb

## 2015-08-12 DIAGNOSIS — R0602 Shortness of breath: Secondary | ICD-10-CM | POA: Diagnosis not present

## 2015-08-12 DIAGNOSIS — R079 Chest pain, unspecified: Secondary | ICD-10-CM

## 2015-08-12 DIAGNOSIS — Z720 Tobacco use: Secondary | ICD-10-CM

## 2015-08-12 DIAGNOSIS — R5383 Other fatigue: Secondary | ICD-10-CM | POA: Diagnosis not present

## 2015-08-12 MED ORDER — ALBUTEROL SULFATE HFA 108 (90 BASE) MCG/ACT IN AERS: 1.0000 | INHALATION_SPRAY | Freq: Four times a day (QID) | RESPIRATORY_TRACT | Status: DC | PRN

## 2015-08-12 NOTE — Progress Notes (Signed)
Patient ID: Lauren Colon, female   DOB: Dec 13, 1956, 59 y.o.   MRN: [id number]      SUBJECTIVE: The patient returns for follow-up after undergoing cardiovascular testing performed for the evaluation of chest pain. Nuclear stress testing demonstrated a small, mild defect in the basal inferoseptal and mid inferoseptal regions with a mild degree of inferoseptal reversibility with suggestion of mild ischemia versus shifting soft tissue attenuation artifact. It was a deemed a low risk study.  I ordered PFT's afterwards given her long history of tobacco use which did not demonstrate any obvious COPD.  She has had one episode of chest pain while lying down which lasted 15 seconds at most. SOB has improved. Significant family stressors at home with daughter.    Review of Systems: As per "subjective", otherwise negative.  Allergies  Allergen Reactions  . Aspirin Nausea Only    Current Outpatient Prescriptions  Medication Sig Dispense Refill  . aspirin 81 MG tablet Take 81 mg by mouth daily.    . nitroGLYCERIN (NITROSTAT) 0.4 MG SL tablet Place 0.4 mg under the tongue every 5 (five) minutes as needed for chest pain.     No current facility-administered medications for this visit.    Past Medical History  Diagnosis Date  . Hypertension     No past surgical history on file.  Social History   Social History  . Marital Status: Married    Spouse Name: N/A  . Number of Children: N/A  . Years of Education: N/A   Occupational History  . Not on file.   Social History Main Topics  . Smoking status: Current Every Day Smoker -- 43 years    Types: Cigarettes  . Smokeless tobacco: Never Used     Comment: started smoking at 59 y/o  . Alcohol Use: No  . Drug Use: No  . Sexual Activity: Not on file   Other Topics Concern  . Not on file   Social History Narrative     Filed Vitals:   08/12/15 1303  BP: 138/84  Pulse: 90  Height:  (1.626 m)  Weight: 153 lb (69.4 kg)    SpO2: 97%    PHYSICAL EXAM General: NAD Neck: No JVD, no thyromegaly or thyroid nodule.  Lungs: Diminished throughout but clear without rales or wheezes. CV: Nondisplaced PMI. Regular rate and rhythm, normal S1/S2, no S3/S4, no murmur. No peripheral edema. No carotid bruit. Normal pedal pulses.  Abdomen: Soft, nontender, no hepatosplenomegaly, no distention.  Skin: Intact without lesions or rashes.  Neurologic: Alert and oriented x 3.  Psych: Normal affect. Extremities: No clubbing or cyanosis.  HEENT: Normal.   ECG: Most recent ECG reviewed.      ASSESSMENT AND PLAN: 1. Chest pressure with fatigue and shortness of breath: Low risk Lexiscan Cardiolite stress test as noted above. Surprising no COPD was seen given her history of tobacco use for several years and concomitant symptoms. Symptoms may be related to significant home stressors. Encouraged to continue taking ASA 81 mg and SL nitro prn. Will prescribe prn albuterol inhaler.  2. Essential HTN: Normal. No changes.  3. Tobacco abuse disorder.  Dispo: f/u 6 months or earlier if symptoms get worse.   Prentice Docker, M.D., F.A.C.C.

## 2015-08-12 NOTE — Patient Instructions (Signed)
Your physician has recommended you make the following change in your medication:  Start Albuterol inhaler 1-2 puffs every six hours as needed for sob. Continue all other medications the same. Your physician recommends that you schedule a follow-up appointment in: 6 months. You will receive a reminder letter in the mail in about 4 months reminding you to call and schedule your appointment. If you don't receive this letter, please contact our office.

## 2016-08-19 ENCOUNTER — Emergency Department (HOSPITAL_COMMUNITY)
Admission: EM | Admit: 2016-08-19 | Discharge: 2016-08-19 | Disposition: A | Discharge status: Home / Self Care | Payer: Self-pay | Attending: Emergency Medicine | Admitting: Emergency Medicine

## 2016-08-19 ENCOUNTER — Encounter (HOSPITAL_COMMUNITY): Payer: Self-pay

## 2016-08-19 DIAGNOSIS — I10 Essential (primary) hypertension: Secondary | ICD-10-CM | POA: Insufficient documentation

## 2016-08-19 DIAGNOSIS — Y939 Activity, unspecified: Secondary | ICD-10-CM | POA: Insufficient documentation

## 2016-08-19 DIAGNOSIS — X58XXXA Exposure to other specified factors, initial encounter: Secondary | ICD-10-CM | POA: Insufficient documentation

## 2016-08-19 DIAGNOSIS — M25559 Pain in unspecified hip: Secondary | ICD-10-CM

## 2016-08-19 DIAGNOSIS — S8011XA Contusion of right lower leg, initial encounter: Secondary | ICD-10-CM | POA: Insufficient documentation

## 2016-08-19 DIAGNOSIS — R030 Elevated blood-pressure reading, without diagnosis of hypertension: Secondary | ICD-10-CM | POA: Insufficient documentation

## 2016-08-19 DIAGNOSIS — S8012XA Contusion of left lower leg, initial encounter: Secondary | ICD-10-CM | POA: Insufficient documentation

## 2016-08-19 DIAGNOSIS — Y999 Unspecified external cause status: Secondary | ICD-10-CM | POA: Insufficient documentation

## 2016-08-19 DIAGNOSIS — Y929 Unspecified place or not applicable: Secondary | ICD-10-CM | POA: Insufficient documentation

## 2016-08-19 DIAGNOSIS — Z7982 Long term (current) use of aspirin: Secondary | ICD-10-CM | POA: Insufficient documentation

## 2016-08-19 DIAGNOSIS — F17210 Nicotine dependence, cigarettes, uncomplicated: Secondary | ICD-10-CM | POA: Insufficient documentation

## 2016-08-19 DIAGNOSIS — Z79899 Other long term (current) drug therapy: Secondary | ICD-10-CM | POA: Insufficient documentation

## 2016-08-19 DIAGNOSIS — M25569 Pain in unspecified knee: Secondary | ICD-10-CM

## 2016-08-19 HISTORY — DX: Fibromyalgia: M79.7

## 2016-08-19 HISTORY — DX: Unspecified osteoarthritis, unspecified site: M19.90

## 2016-08-19 LAB — CBC WITH DIFFERENTIAL/PLATELET
Basophils Absolute: 0 10*3/uL (ref 0.0–0.1)
Basophils Relative: 0 %
Eosinophils Absolute: 0.3 10*3/uL (ref 0.0–0.7)
Eosinophils Relative: 2 %
HEMATOCRIT: 44.1 % (ref 36.0–46.0)
Hemoglobin: 14.2 g/dL (ref 12.0–15.0)
Lymphocytes Relative: 30 %
Lymphs Abs: 3.4 10*3/uL (ref 0.7–4.0)
MCH: 28.7 pg (ref 26.0–34.0)
MCHC: 32.2 g/dL (ref 30.0–36.0)
MCV: 89.3 fL (ref 78.0–100.0)
MONOS PCT: 5 %
Monocytes Absolute: 0.6 10*3/uL (ref 0.1–1.0)
NEUTROS PCT: 63 %
Neutro Abs: 7 10*3/uL (ref 1.7–7.7)
Platelets: 215 10*3/uL (ref 150–400)
RBC: 4.94 MIL/uL (ref 3.87–5.11)
RDW: 14.4 % (ref 11.5–15.5)
WBC: 11.3 10*3/uL — ABNORMAL HIGH (ref 4.0–10.5)

## 2016-08-19 LAB — COMPREHENSIVE METABOLIC PANEL
ALT: 12 U/L — AB (ref 14–54)
AST: 16 U/L (ref 15–41)
Albumin: 3.9 g/dL (ref 3.5–5.0)
Alkaline Phosphatase: 76 U/L (ref 38–126)
Anion gap: 5 (ref 5–15)
BILIRUBIN TOTAL: 0.5 mg/dL (ref 0.3–1.2)
BUN: 13 mg/dL (ref 6–20)
CALCIUM: 9.2 mg/dL (ref 8.9–10.3)
CHLORIDE: 106 mmol/L (ref 101–111)
CO2: 24 mmol/L (ref 22–32)
CREATININE: 0.75 mg/dL (ref 0.44–1.00)
GFR calc Af Amer: >60 mL/min (ref >60)
GFR calc non Af Amer: >60 mL/min (ref >60)
Glucose, Bld: 86 mg/dL (ref 65–99)
Potassium: 3.8 mmol/L (ref 3.5–5.1)
Sodium: 135 mmol/L (ref 135–145)
TOTAL PROTEIN: 6.7 g/dL (ref 6.5–8.1)

## 2016-08-19 MED ORDER — ACETAMINOPHEN 500 MG PO TABS
1000.0000 mg | ORAL_TABLET | Freq: Once | ORAL | Status: AC
  Administered 2016-08-19: 1000 mg via ORAL
  Filled 2016-08-19: qty 2

## 2016-08-19 NOTE — ED Triage Notes (Signed)
Patient complains of months of general body pain and worse pain in her legs for same. denis trauma. Thinks related to her arthritis. NAD

## 2016-08-19 NOTE — Discharge Instructions (Signed)
Take acetaminophen as directed for pain. Your blood pressure should be rechecked within the next 3 weeks. Today's was elevated at 147/98. Ask Dr.McInnis to help you to stop smoking

## 2016-08-19 NOTE — ED Provider Notes (Addendum)
[facility]-EMERGENCY DEPT Provider Note   CSN: [id number] Arrival date & time: 08/19/16  1150     History   Chief Complaint Chief Complaint  Patient presents with  . Leg Pain    HPI Lauren Colon is a 60 y.o. female.Complains of bilateral knee pain bilateral groin pain and bilateral foot pain for many years, unchanged today, pain is worse with walking or changing position. Improved with remaining still She also reports easy bruising for the past several weeks. She denies blood in stools she's noticed bruises on her legs without trauma. No treatment prior to coming here. No other associated symptoms. No chest pain no shortness of breath. No fever.  HPI  Past Medical History:  Diagnosis Date  . Arthritis   . Fibromyalgia   . Hypertension     Patient Active Problem List   Diagnosis Date Noted  . SOB (shortness of breath) 08/12/2015  . Chest pain 07/14/2015    History reviewed. No pertinent surgical history.  OB History    No data available       Home Medications    Prior to Admission medications   Medication Sig Start Date End Date Taking? Authorizing Provider  albuterol (PROVENTIL HFA;VENTOLIN HFA) 108 (90 BASE) MCG/ACT inhaler Inhale 1-2 puffs into the lungs every 6 (six) hours as needed for wheezing or shortness of breath. For shortness of breath 08/12/15   Laqueta LindenSuresh A Koneswaran, MD  aspirin 81 MG tablet Take 81 mg by mouth daily.    Historical Provider, MD  nitroGLYCERIN (NITROSTAT) 0.4 MG SL tablet Place 0.4 mg under the tongue every 5 (five) minutes as needed for chest pain.    Historical Provider, MD    Family History Family History  Problem Relation Age of Onset  . Heart attack Brother   . Heart disease Brother     Social History Social History  Substance Use Topics  . Smoking status: Current Every Day Smoker    Years: 43.00    Types: Cigarettes  . Smokeless tobacco: Never Used     Comment: started smoking at 60 y/o  . Alcohol use No   No illicit drug  use  Allergies   Aspirin   Review of Systems Review of Systems  Constitutional: Negative.   HENT: Negative.   Respiratory: Negative.   Cardiovascular: Negative.   Gastrointestinal: Negative.   Musculoskeletal: Positive for arthralgias.  Skin: Negative.   Neurological: Negative.   Hematological: Bruises/bleeds easily.  Psychiatric/Behavioral: Negative.   All other systems reviewed and are negative.    Physical Exam Updated Vital Signs BP 171/92 (BP Location: Left Arm)   Pulse 83   Temp 98.4 F (36.9 C) (Oral)   Resp 18   Ht 5\' 4"  (1.626 m)   Wt 165 lb (74.8 kg)   SpO2 97%   BMI 28.32 kg/m   Physical Exam  Constitutional: She appears well-developed and well-nourished.  HENT:  Head: Normocephalic and atraumatic.  Eyes: Conjunctivae are normal. Pupils are equal, round, and reactive to light.  Neck: Neck supple. No tracheal deviation present. No thyromegaly present.  Cardiovascular: Normal rate, regular rhythm and intact distal pulses.   No murmur heard. Pulmonary/Chest: Effort normal and breath sounds normal.  Abdominal: Soft. Bowel sounds are normal. She exhibits no distension. There is no tenderness.  Musculoskeletal: Normal range of motion. She exhibits no edema or tenderness.  All 4 extremities without redness swelling or point tenderness. Neurovascular intact. No deformity. DP and radial pulses 2+ bilaterally  Neurological: She  is alert. Coordination normal.  Skin: Skin is warm and dry.  There are a few dime sized ecchymoses on her shins, brownish in color. No petechiae no other rash.  Psychiatric: She has a normal mood and affect.  Nursing note and vitals reviewed.    ED Treatments / Results  Labs (all labs ordered are listed, but only abnormal results are displayed) Labs Reviewed - No data to display  EKG  EKG Interpretation None       Radiology No results found.  Procedures Procedures (including critical care time)  Medications Ordered in  ED Medications - No data to display  Results for orders placed or performed during the hospital encounter of 08/19/16  Comprehensive metabolic panel  Result Value Ref Range   Sodium 135 135 - 145 mmol/L   Potassium 3.8 3.5 - 5.1 mmol/L   Chloride 106 101 - 111 mmol/L   CO2 24 22 - 32 mmol/L   Glucose, Bld 86 65 - 99 mg/dL   BUN 13 6 - 20 mg/dL   Creatinine, Ser 2.95 0.44 - 1.00 mg/dL   Calcium 9.2 8.9 - 62.1 mg/dL   Total Protein 6.7 6.5 - 8.1 g/dL   Albumin 3.9 3.5 - 5.0 g/dL   AST 16 15 - 41 U/L   ALT 12 (L) 14 - 54 U/L   Alkaline Phosphatase 76 38 - 126 U/L   Total Bilirubin 0.5 0.3 - 1.2 mg/dL   GFR calc non Af Amer >60 >60 mL/min   GFR calc Af Amer >60 >60 mL/min   Anion gap 5 5 - 15  CBC with Differential/Platelet  Result Value Ref Range   WBC 11.3 (H) 4.0 - 10.5 K/uL   RBC 4.94 3.87 - 5.11 MIL/uL   Hemoglobin 14.2 12.0 - 15.0 g/dL   HCT 30.8 65.7 - 84.6 %   MCV 89.3 78.0 - 100.0 fL   MCH 28.7 26.0 - 34.0 pg   MCHC 32.2 30.0 - 36.0 g/dL   RDW 96.2 95.2 - 84.1 %   Platelets 215 150 - 400 K/uL   Neutrophils Relative % 63 %   Neutro Abs 7.0 1.7 - 7.7 K/uL   Lymphocytes Relative 30 %   Lymphs Abs 3.4 0.7 - 4.0 K/uL   Monocytes Relative 5 %   Monocytes Absolute 0.6 0.1 - 1.0 K/uL   Eosinophils Relative 2 %   Eosinophils Absolute 0.3 0.0 - 0.7 K/uL   Basophils Relative 0 %   Basophils Absolute 0.0 0.0 - 0.1 K/uL   No results found. Initial Impression / Assessment and Plan / ED Course  I have reviewed the triage vital signs and the nursing notes.  Pertinent labs & imaging results that were available during my care of the patient were reviewed by me and considered in my medical decision making (see chart for details).  Clinical Course    No evidence of coagulopathy. No evidence of vascular insufficiency or infection 4 PM she feels much improved and ready to go home after treatment with Tylenol. I counseled patient for 5 minutes on smoking cessation. Repeat blood  pressure 3 weeks  Final Clinical Impressions(s) / ED Diagnoses  Diagnoses #1 Chronic arthralgias  #2 tobacco abuse  #3 elevated blood pressure Final diagnoses:  None    New Prescriptions New Prescriptions   No medications on file     Doug Sou, MD 08/19/16 1617    Doug Sou, MD 08/19/16 1620

## 2016-08-19 NOTE — ED Notes (Signed)
Pt. Going home alone. Decreased  pain and papers reviewed

## 2017-02-12 ENCOUNTER — Telehealth: Payer: Self-pay | Admitting: Cardiovascular Disease

## 2017-02-12 NOTE — Telephone Encounter (Signed)
Numerous attempts to contact patient with recall letters. Unable to reach by telephone. with no success.    Dyane Dustmanerry L Goins [[id number]][[id number]] 08/12/2015 1:47 PM New [10]   [System] 04/16/2016 11:03 PM Notification Sent [20]   Megan SalonVicky T Slaughter [[id number]][[id number]] 09/05/2016 2:59 PM Notification Sent [20]   Geraldine ContrasStephanie R Smith [[id number]][[id number]] 12/06/2016 9:50 AM Notification Sent [20]   Megan SalonVicky T Slaughter [[id number]][[id number]] 02/12/2017 3:07 PM Notification Sent [20]  Appointment Notes  1 yr f/u per checkout on 08/12/15/tg

## 2017-06-05 ENCOUNTER — Encounter (HOSPITAL_COMMUNITY): Payer: Self-pay | Admitting: Emergency Medicine

## 2017-06-05 ENCOUNTER — Emergency Department (HOSPITAL_COMMUNITY): Payer: Self-pay

## 2017-06-05 ENCOUNTER — Emergency Department (HOSPITAL_COMMUNITY)
Admission: EM | Admit: 2017-06-05 | Discharge: 2017-06-06 | Disposition: A | Discharge status: Home / Self Care | Payer: Self-pay | Attending: Emergency Medicine | Admitting: Emergency Medicine

## 2017-06-05 DIAGNOSIS — I10 Essential (primary) hypertension: Secondary | ICD-10-CM | POA: Insufficient documentation

## 2017-06-05 DIAGNOSIS — Z791 Long term (current) use of non-steroidal anti-inflammatories (NSAID): Secondary | ICD-10-CM | POA: Insufficient documentation

## 2017-06-05 DIAGNOSIS — R1030 Lower abdominal pain, unspecified: Secondary | ICD-10-CM | POA: Insufficient documentation

## 2017-06-05 DIAGNOSIS — Z79899 Other long term (current) drug therapy: Secondary | ICD-10-CM | POA: Insufficient documentation

## 2017-06-05 DIAGNOSIS — F17210 Nicotine dependence, cigarettes, uncomplicated: Secondary | ICD-10-CM | POA: Insufficient documentation

## 2017-06-05 DIAGNOSIS — R110 Nausea: Secondary | ICD-10-CM | POA: Insufficient documentation

## 2017-06-05 DIAGNOSIS — Z7982 Long term (current) use of aspirin: Secondary | ICD-10-CM | POA: Insufficient documentation

## 2017-06-05 LAB — URINALYSIS, ROUTINE W REFLEX MICROSCOPIC
BACTERIA UA: NONE SEEN
Bilirubin Urine: NEGATIVE
Glucose, UA: NEGATIVE mg/dL
KETONES UR: 5 mg/dL — AB
Leukocytes, UA: NEGATIVE
Nitrite: NEGATIVE
Protein, ur: 30 mg/dL — AB
Specific Gravity, Urine: 1.023 (ref 1.005–1.030)
pH: 7 (ref 5.0–8.0)

## 2017-06-05 LAB — CBC WITH DIFFERENTIAL/PLATELET
BASOS ABS: 0 10*3/uL (ref 0.0–0.1)
Basophils Relative: 0 %
EOS ABS: 0.1 10*3/uL (ref 0.0–0.7)
Eosinophils Relative: 1 %
HCT: 40.8 % (ref 36.0–46.0)
Hemoglobin: 13.5 g/dL (ref 12.0–15.0)
LYMPHS ABS: 1.8 10*3/uL (ref 0.7–4.0)
Lymphocytes Relative: 15 %
MCH: 28.9 pg (ref 26.0–34.0)
MCHC: 33.1 g/dL (ref 30.0–36.0)
MCV: 87.4 fL (ref 78.0–100.0)
Monocytes Absolute: 0.6 10*3/uL (ref 0.1–1.0)
Monocytes Relative: 5 %
NEUTROS PCT: 79 %
Neutro Abs: 9.3 10*3/uL — ABNORMAL HIGH (ref 1.7–7.7)
PLATELETS: 182 10*3/uL (ref 150–400)
RBC: 4.67 MIL/uL (ref 3.87–5.11)
RDW: 14.4 % (ref 11.5–15.5)
WBC: 11.8 10*3/uL — AB (ref 4.0–10.5)

## 2017-06-05 LAB — COMPREHENSIVE METABOLIC PANEL
ALT: 14 U/L (ref 14–54)
AST: 20 U/L (ref 15–41)
Albumin: 4.2 g/dL (ref 3.5–5.0)
Alkaline Phosphatase: 69 U/L (ref 38–126)
Anion gap: 10 (ref 5–15)
BILIRUBIN TOTAL: 0.5 mg/dL (ref 0.3–1.2)
BUN: 16 mg/dL (ref 6–20)
CO2: 24 mmol/L (ref 22–32)
CREATININE: 0.75 mg/dL (ref 0.44–1.00)
Calcium: 9.2 mg/dL (ref 8.9–10.3)
Chloride: 104 mmol/L (ref 101–111)
Glucose, Bld: 103 mg/dL — ABNORMAL HIGH (ref 65–99)
POTASSIUM: 3.6 mmol/L (ref 3.5–5.1)
Sodium: 138 mmol/L (ref 135–145)
Total Protein: 7.3 g/dL (ref 6.5–8.1)

## 2017-06-05 LAB — LIPASE, BLOOD: LIPASE: 32 U/L (ref 11–51)

## 2017-06-05 MED ORDER — ONDANSETRON HCL 4 MG/2ML IJ SOLN
4.0000 mg | Freq: Once | INTRAMUSCULAR | Status: AC
  Administered 2017-06-05: 4 mg via INTRAVENOUS
  Filled 2017-06-05: qty 2

## 2017-06-05 MED ORDER — SODIUM CHLORIDE 0.9 % IV BOLUS (SEPSIS)
1000.0000 mL | Freq: Once | INTRAVENOUS | Status: AC
  Administered 2017-06-05: 1000 mL via INTRAVENOUS

## 2017-06-05 MED ORDER — IOPAMIDOL (ISOVUE-300) INJECTION 61%
100.0000 mL | Freq: Once | INTRAVENOUS | Status: AC | PRN
  Administered 2017-06-05: 100 mL via INTRAVENOUS

## 2017-06-05 MED ORDER — IOPAMIDOL (ISOVUE-300) INJECTION 61%
INTRAVENOUS | Status: AC
  Administered 2017-06-05: 15 mL
  Filled 2017-06-05: qty 30

## 2017-06-05 NOTE — ED Notes (Signed)
Pt reports having 1 episode of V/ today, denies abd pain, N/D/, or GU sx at this time.

## 2017-06-05 NOTE — ED Triage Notes (Signed)
N/V since around 2pm.  Denies diarrhea.

## 2017-06-05 NOTE — ED Provider Notes (Signed)
[facility]-EMERGENCY DEPT Provider Note   CSN: [id number] Arrival date & time: 06/05/17  2122     History   Chief Complaint Chief Complaint  Patient presents with  . Nausea    HPI Lauren Colon is a 61 y.o. female.  HPI  Patient presents to ED for complaints of nausea that began approximately 8 hours prior to arrival. She also reports 4 episodes of vomiting. She states that she ate a normal breakfast and normal lunch when she began having nausea. Emesis was NBNB. She reports some associated lightheadedness. She reports some epigastric abdominal pain and cramping. She denies any previous history of similar symptoms. She denies any sick contacts, recent travel. Reports normal bowel movements, no diarrhea, constipation or blood in stool. Denies dysuria, hematuria, fever, syncope, vaginal bleeding, pelvic pain, trouble breathing, chest pain. Patient reports chronic tobacco use. She also states that she takes ibuprofen sometimes multiple times a day for her leg pain. No known history of ulcers. Denies any alcohol or other drug use. Her only abdominal surgery was tubal ligation approximately 30 years ago.  Past Medical History:  Diagnosis Date  . Arthritis   . Fibromyalgia   . Hypertension     Patient Active Problem List   Diagnosis Date Noted  . SOB (shortness of breath) 08/12/2015  . Chest pain 07/14/2015    Past Surgical History:  Procedure Laterality Date  . TUBAL LIGATION      OB History    No data available       Home Medications    Prior to Admission medications   Medication Sig Start Date End Date Taking? Authorizing Provider  aspirin 81 MG tablet Take 81 mg by mouth daily.   Yes [provider]  ibuprofen (ADVIL,MOTRIN) 200 MG tablet Take 200 mg by mouth every 6 (six) hours as needed for moderate pain.   Yes [provider]  nitroGLYCERIN (NITROSTAT) 0.4 MG SL tablet Place 0.4 mg under the tongue every 5 (five) minutes as needed for chest pain.    Yes [provider]  albuterol (PROVENTIL HFA;VENTOLIN HFA) 108 (90 BASE) MCG/ACT inhaler Inhale 1-2 puffs into the lungs every 6 (six) hours as needed for wheezing or shortness of breath. For shortness of breath 08/12/15   Laqueta LindenKoneswaran, Suresh A, MD  ondansetron (ZOFRAN) 4 MG tablet Take 1 tablet (4 mg total) by mouth every 8 (eight) hours as needed for nausea or vomiting. 06/06/17   Dietrich PatesKhatri, Osmani Kersten, PA-C    Family History Family History  Problem Relation Age of Onset  . Heart attack Brother   . Heart disease Brother     Social History Social History  Substance Use Topics  . Smoking status: Current Every Day Smoker    Packs/day: 0.50    Years: 43.00    Types: Cigarettes  . Smokeless tobacco: Never Used     Comment: started smoking at 61 y/o  . Alcohol use No     Allergies   Aspirin   Review of Systems Review of Systems  Constitutional: Positive for appetite change. Negative for chills and fever.  HENT: Negative for ear pain, rhinorrhea, sneezing and sore throat.   Eyes: Negative for photophobia and visual disturbance.  Respiratory: Negative for cough, chest tightness, shortness of breath and wheezing.   Cardiovascular: Negative for chest pain and palpitations.  Gastrointestinal: Positive for abdominal pain, nausea and vomiting. Negative for blood in stool, constipation and diarrhea.  Genitourinary: Negative for dysuria, flank pain, hematuria, pelvic pain, urgency, vaginal  bleeding and vaginal discharge.  Musculoskeletal: Negative for back pain and myalgias.  Skin: Negative for rash.  Neurological: Positive for light-headedness. Negative for dizziness, weakness and headaches.     Physical Exam Updated Vital Signs BP (!) 177/96   Pulse 75   Temp 98 F (36.7 C) (Oral)   Resp 18   Ht 5\' 4"  (1.626 m)   Wt 67.1 kg (148 lb)   SpO2 96%   BMI 25.40 kg/m   Physical Exam  Constitutional: She appears well-developed and well-nourished. No distress.  HENT:  Head:  Normocephalic and atraumatic.  Nose: Nose normal.  Eyes: Conjunctivae and EOM are normal. Right eye exhibits no discharge. Left eye exhibits no discharge. No scleral icterus.  Neck: Normal range of motion. Neck supple.  Cardiovascular: Normal rate, regular rhythm, normal heart sounds and intact distal pulses.  Exam reveals no gallop and no friction rub.   No murmur heard. Pulmonary/Chest: Effort normal and breath sounds normal. No respiratory distress.  Abdominal: Soft. Bowel sounds are normal. She exhibits no distension. There is tenderness (Left lower quadrant tenderness to palpation ) in the left lower quadrant. There is no rebound, no guarding, no CVA tenderness and negative Murphy's sign.  Musculoskeletal: Normal range of motion. She exhibits no edema.  Neurological: She is alert. She exhibits normal muscle tone. Coordination normal.  Skin: Skin is warm and dry. No rash noted.  Psychiatric: She has a normal mood and affect.  Nursing note and vitals reviewed.    ED Treatments / Results  Labs (all labs ordered are listed, but only abnormal results are displayed) Labs Reviewed  COMPREHENSIVE METABOLIC PANEL - Abnormal; Notable for the following:       Result Value   Glucose, Bld 103 (*)    All other components within normal limits  CBC WITH DIFFERENTIAL/PLATELET - Abnormal; Notable for the following:    WBC 11.8 (*)    Neutro Abs 9.3 (*)    All other components within normal limits  URINALYSIS, ROUTINE W REFLEX MICROSCOPIC - Abnormal; Notable for the following:    APPearance HAZY (*)    Hgb urine dipstick MODERATE (*)    Ketones, ur 5 (*)    Protein, ur 30 (*)    Squamous Epithelial / LPF 0-5 (*)    All other components within normal limits  LIPASE, BLOOD    EKG  EKG Interpretation None       Radiology Ct Abdomen Pelvis W Contrast  Result Date: 06/06/2017 CLINICAL DATA:  Acute onset of nausea and vomiting. Left lower quadrant tenderness. Initial encounter. EXAM: CT  ABDOMEN AND PELVIS WITH CONTRAST TECHNIQUE: Multidetector CT imaging of the abdomen and pelvis was performed using the standard protocol following bolus administration of intravenous contrast. CONTRAST:  ISOVUE-300 IOPAMIDOL (ISOVUE-300) INJECTION 61% COMPARISON:  CT of the abdomen and pelvis performed 03/16/2011 FINDINGS: Lower chest: The visualized lung bases are grossly clear. The visualized portions of the mediastinum are unremarkable. Hepatobiliary: Small hypodensities are noted within the liver, measuring up to 5 mm in size. A calcified granuloma is noted at the right hepatic lobe. The gallbladder is unremarkable in appearance. The common bile duct remains normal in caliber. Pancreas: The pancreas is within normal limits. Spleen: The spleen is unremarkable in appearance. Adrenals/Urinary Tract: The adrenal glands are unremarkable in appearance. The kidneys are within normal limits. There is no mass or hydronephrosis. No renal or ureteral stones are identified. No perinephric stranding is appreciated. Stomach/Bowel: The stomach is unremarkable in appearance.  The small bowel is within normal limits. The appendix is normal in caliber, without evidence of appendicitis. The Colon is unremarkable in appearance. Vascular/Lymphatic: Scattered calcification is seen along the abdominal aorta and its branches. The abdominal aorta is otherwise grossly unremarkable. The inferior vena cava is grossly unremarkable. No retroperitoneal lymphadenopathy is seen. No pelvic sidewall lymphadenopathy is identified. Reproductive: The bladder is mildly distended and grossly unremarkable. A uterine fibroid is noted. The uterus is otherwise unremarkable. The ovaries are grossly symmetric. No suspicious adnexal masses are seen. Other: No additional soft tissue abnormalities are seen. Musculoskeletal: No acute osseous abnormalities are identified. The visualized musculature is unremarkable in appearance. IMPRESSION: 1. No acute  abnormality seen to explain the patient's symptoms. 2. Scattered aortic atherosclerosis. 3. Small uterine fibroid noted. 4. Small nonspecific hypodensities within the liver, measuring up to 5 mm in size. Electronically Signed   By: Roanna Raider M.D.   On: 06/06/2017 00:32    Procedures Procedures (including critical care time)  Medications Ordered in ED Medications  sodium chloride 0.9 % bolus 1,000 mL (0 mLs Intravenous Stopped 06/06/17 0011)  ondansetron (ZOFRAN) injection 4 mg (4 mg Intravenous Given 06/05/17 2226)  iopamidol (ISOVUE-300) 61 % injection 100 mL (100 mLs Intravenous Contrast Given 06/05/17 2324)  iopamidol (ISOVUE-300) 61 % injection (15 mLs  Contrast Given 06/05/17 2323)     Initial Impression / Assessment and Plan / ED Course  I have reviewed the triage vital signs and the nursing notes.  Pertinent labs & imaging results that were available during my care of the patient were reviewed by me and considered in my medical decision making (see chart for details).     Patient presents to ED for complaints of nausea and 4 episodes of vomiting. She reports epigastric pain but is tender to palpation in the left lower quadrant. She states that she has been experiencing this left lower quadrant pain for several months and is unsure what causing it. She is afebrile with no history of fever. She states that her sister was recently diagnosed with ovarian cancer in that her mother has a history of cancer as well. She reports some chronic NSAID use as well as tobacco use. Patient is afebrile with no history of fever. She is nontoxic appearing and is nonacute distress at this time. No known history of ulcers. CBC revealed leukocytosis at 11.8. CMP, lipase, UA unremarkable at this time. CT of the abdomen and pelvis with contrast was obtained and showed no acute intra-abdominal process or infectious process at this time. She does have a small fibroid present which could be causing her pain.  Patient reports feeling much better with Zofran and fluids given here in the ED. I have low suspicion for any acute abdominal process causing symptoms. I advised patient to maintain adequate fluid and food intake. Will discharge with Zofran and advised her to follow up with PCP for further evaluation. Strict return precautions given.  Final Clinical Impressions(s) / ED Diagnoses   Final diagnoses:  Nausea  Lower abdominal pain    New Prescriptions New Prescriptions   ONDANSETRON (ZOFRAN) 4 MG TABLET    Take 1 tablet (4 mg total) by mouth every 8 (eight) hours as needed for nausea or vomiting.     Dietrich Pates, PA-C 06/06/17 0113    Loren Racer, MD 06/11/17 1806

## 2017-06-06 MED ORDER — ONDANSETRON HCL 4 MG PO TABS: 4.0000 mg | ORAL_TABLET | Freq: Three times a day (TID) | ORAL | 0 refills | Status: DC | PRN

## 2017-06-06 NOTE — ED Notes (Signed)
Pt ambulated with steady gait to BR without assistance

## 2017-06-06 NOTE — Discharge Instructions (Signed)
Take Zofran as needed for nausea. Return to ED for worsening pain, fevers, blood in stool, blood in vomit, lightheadedness, loss of consciousness, urinary symptoms, severe abdominal pain.

## 2017-09-06 IMAGING — CT CT ABD-PELV W/ CM
2 of 6 series · 16 of 46 positions shown, 18 images · IV contrast (Isovue)
Comparison: CT of the abdomen and pelvis performed 03/16/2011

CLINICAL DATA: Acute onset of nausea and vomiting. Left lower
quadrant tenderness. Initial encounter.

EXAM:
CT ABDOMEN AND PELVIS WITH CONTRAST
TECHNIQUE: Multidetector CT imaging of the abdomen and pelvis was performed
using the standard protocol following bolus administration of
intravenous contrast.
CONTRAST:  100mL TM4NTW-H33 IOPAMIDOL (TM4NTW-H33) INJECTION 61%

[Series 7: coronal st · coronal · 0.71mm/px · 3 of 87 slices shown]
[im 29/87  soft-tissue]
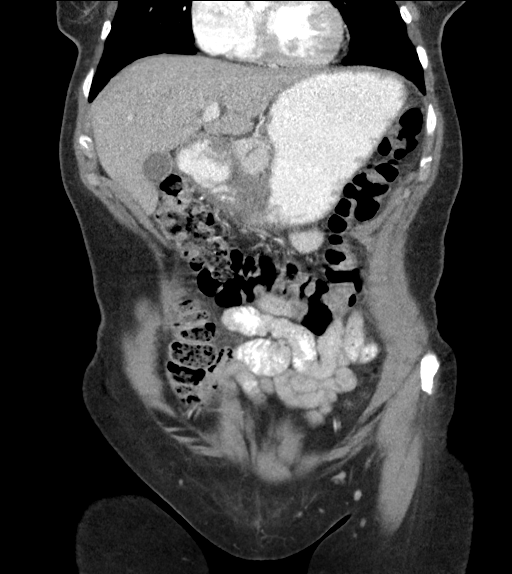
[im 39/87  soft-tissue]
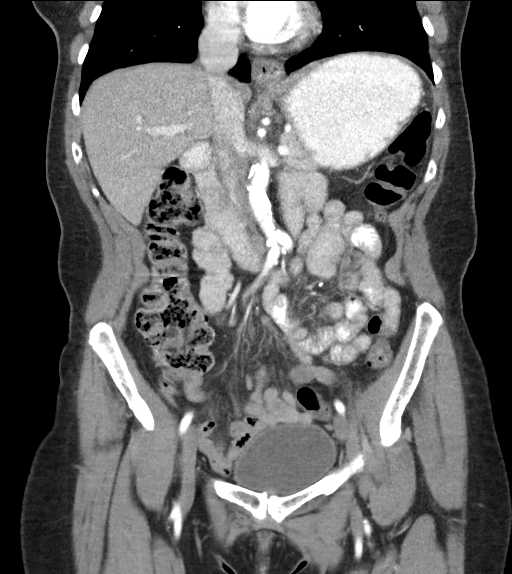
[im 48/87  soft-tissue]
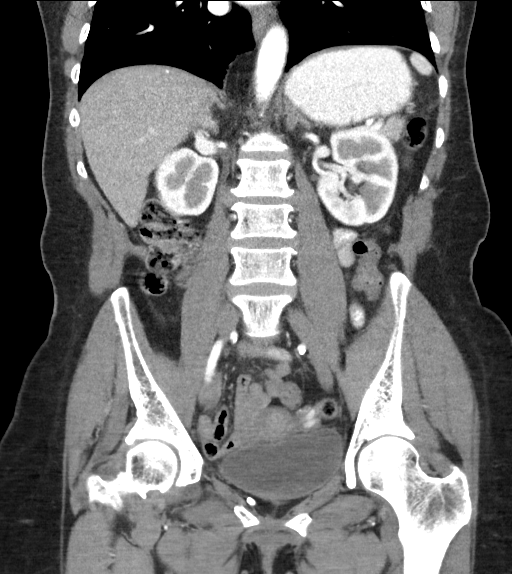

[Series 9: axial st · axial · 0.60mm/px · z∈[+730,+1070]mm · 13 of 78 slices shown, 15 images]
[im 5/78  soft-tissue]
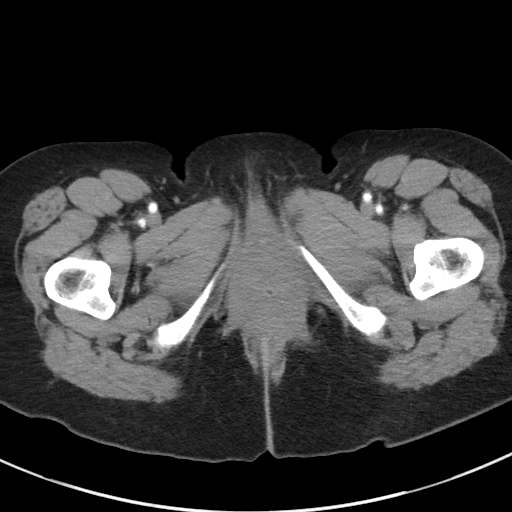
[im 5/78  bone]
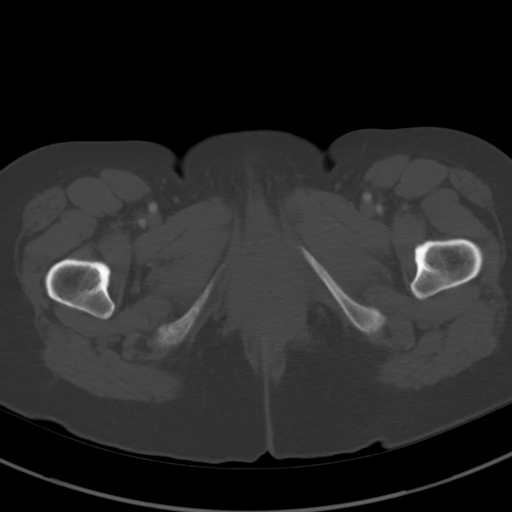
[im 10/78  soft-tissue]
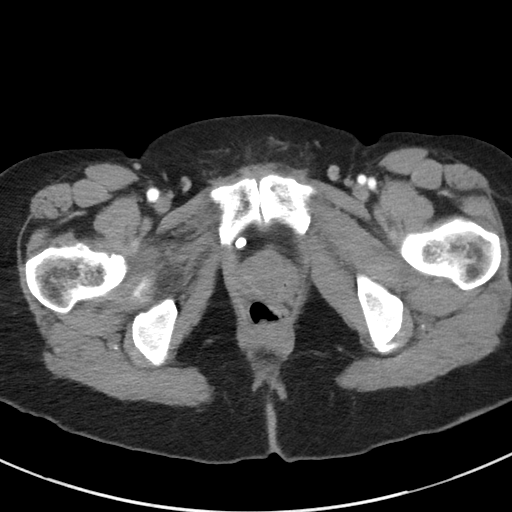
[im 19/78  soft-tissue]
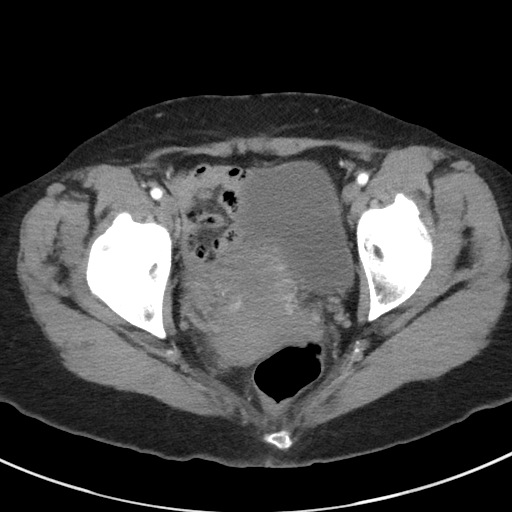
[im 23/78  soft-tissue]
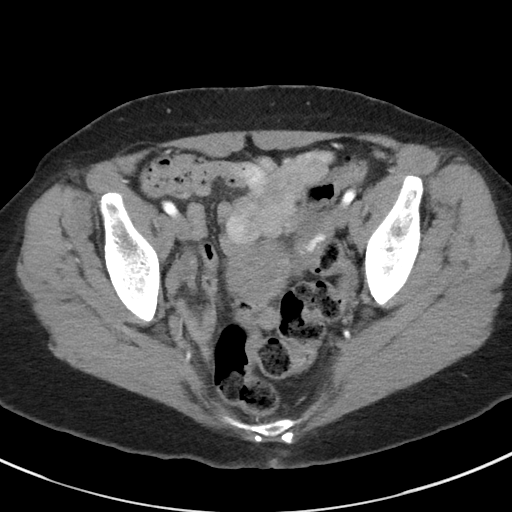
[im 28/78  soft-tissue]
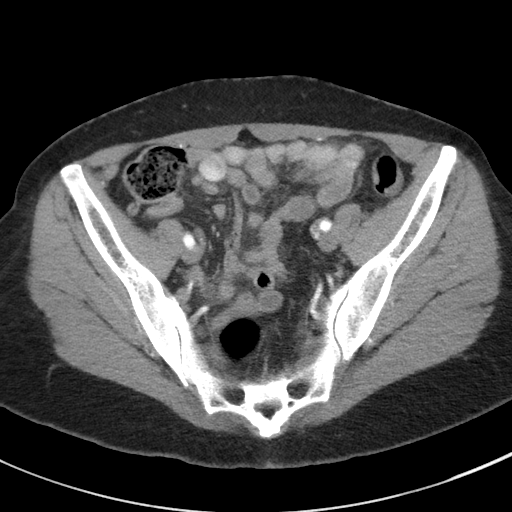
[im 32/78  soft-tissue]
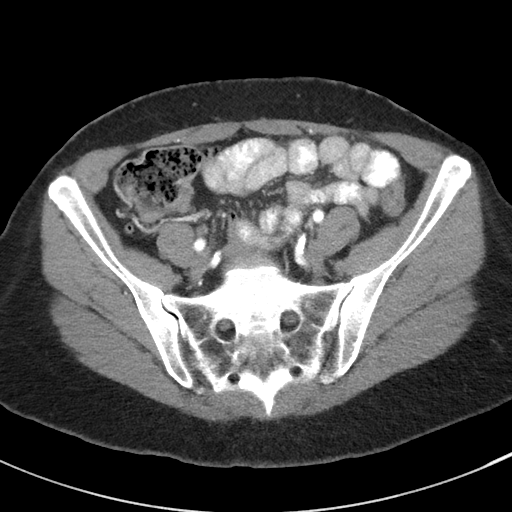
[im 41/78  soft-tissue]
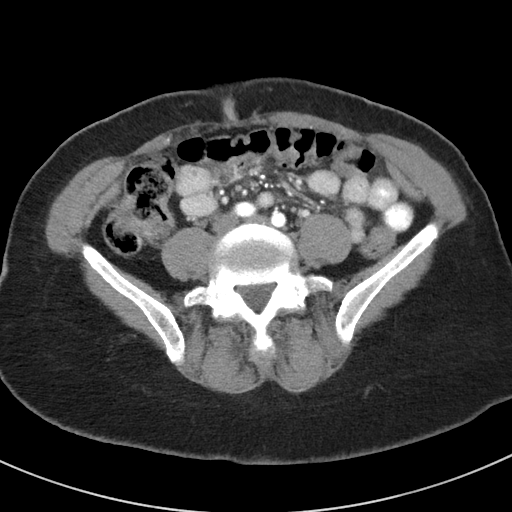
[im 46/78  soft-tissue]
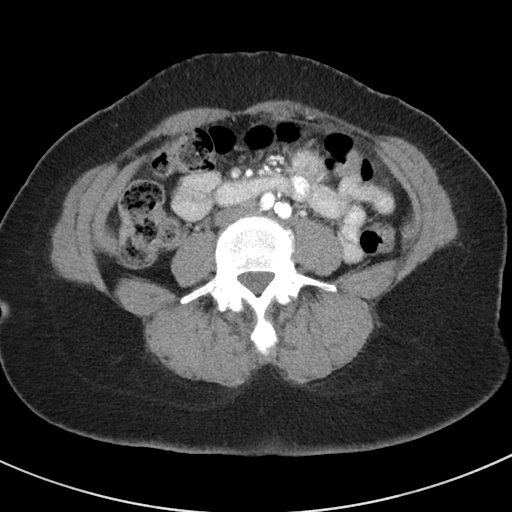
[im 50/78  soft-tissue]
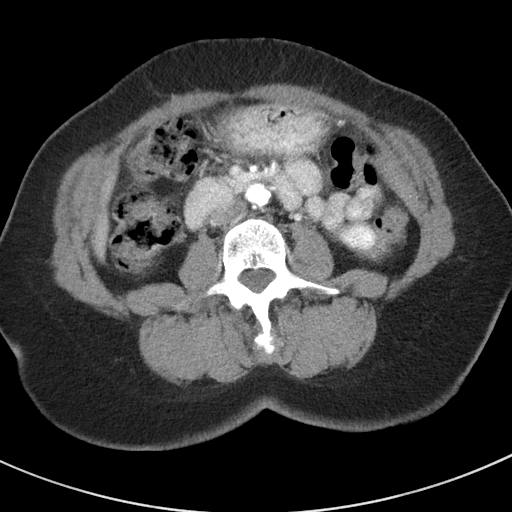
[im 50/78  bone]
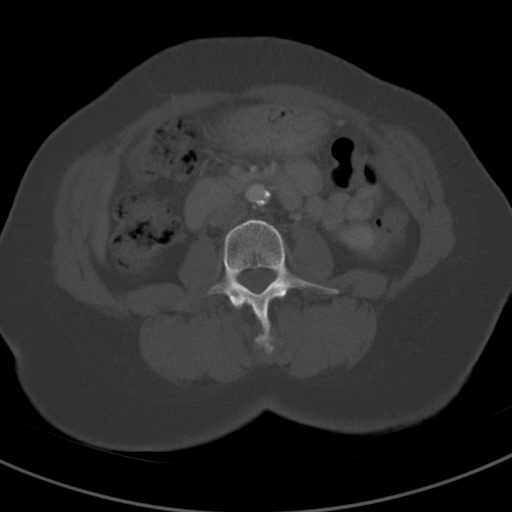
[im 55/78  soft-tissue]
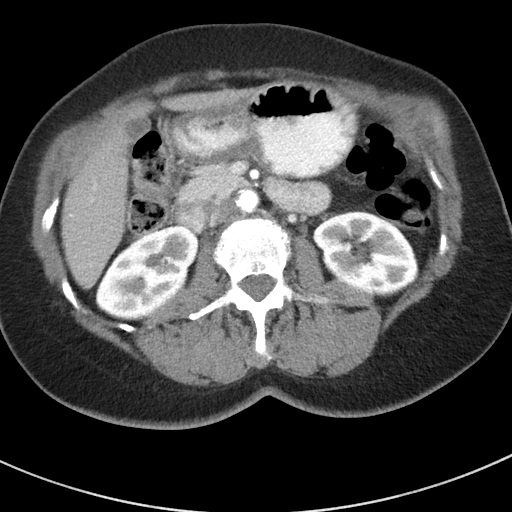
[im 59/78  soft-tissue]
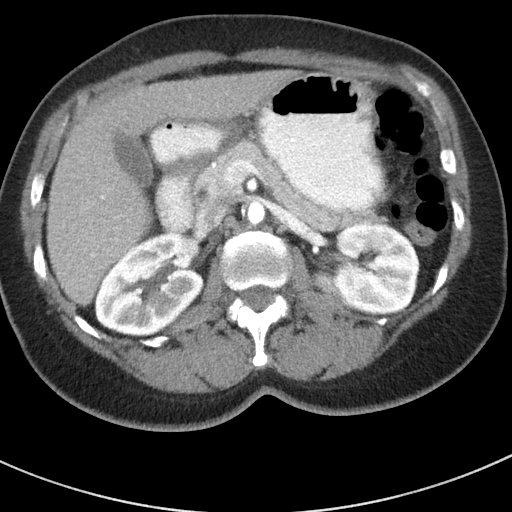
[im 68/78  soft-tissue]
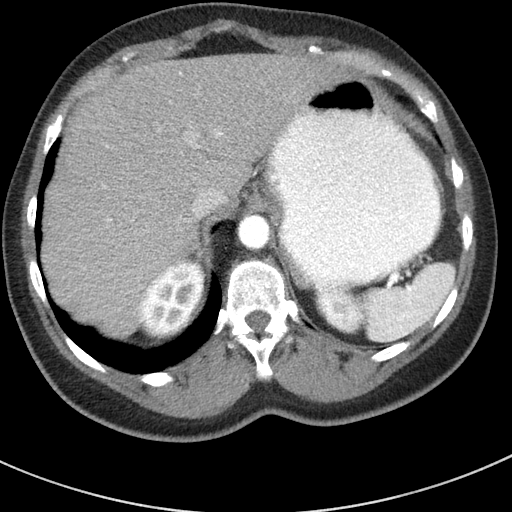
[im 73/78  soft-tissue]
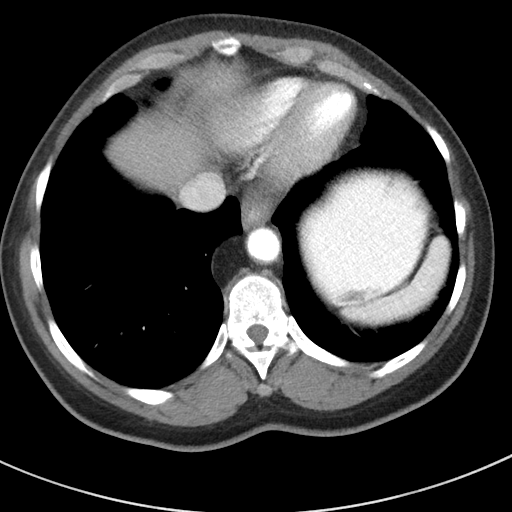

[16 of 46 positions shown; findings below may reference images not displayed]

FINDINGS: Lower chest: The visualized lung bases are grossly clear. The
visualized portions of the mediastinum are unremarkable.

Hepatobiliary: Small hypodensities are noted within the liver,
measuring up to 5 mm in size. A calcified granuloma is noted at the
right hepatic lobe. The gallbladder is unremarkable in appearance.
The common bile duct remains normal in caliber.

Pancreas: The pancreas is within normal limits.

Spleen: The spleen is unremarkable in appearance.

Adrenals/Urinary Tract: The adrenal glands are unremarkable in
appearance.

The kidneys are within normal limits. There is no mass or
hydronephrosis. No renal or ureteral stones are identified. No
perinephric stranding is appreciated.

Stomach/Bowel: The stomach is unremarkable in appearance. The small
bowel is within normal limits. The appendix is normal in caliber,
without evidence of appendicitis. The colon is unremarkable in
appearance.

Vascular/Lymphatic: Scattered calcification is seen along the
abdominal aorta and its branches. The abdominal aorta is otherwise
grossly unremarkable. The inferior vena cava is grossly
unremarkable. No retroperitoneal lymphadenopathy is seen. No pelvic
sidewall lymphadenopathy is identified.

Reproductive: The bladder is mildly distended and grossly
unremarkable. A uterine fibroid is noted. The uterus is otherwise
unremarkable. The ovaries are grossly symmetric. No suspicious
adnexal masses are seen.

Other: No additional soft tissue abnormalities are seen.

Musculoskeletal: No acute osseous abnormalities are identified. The
visualized musculature is unremarkable in appearance.
IMPRESSION: 1. No acute abnormality seen to explain the patient's symptoms.
2. Scattered aortic atherosclerosis.
3. Small uterine fibroid noted.
4. Small nonspecific hypodensities within the liver, measuring up to
5 mm in size.

## 2017-11-22 ENCOUNTER — Emergency Department (HOSPITAL_COMMUNITY)
Admission: EM | Admit: 2017-11-22 | Discharge: 2017-11-23 | Disposition: A | Discharge status: Home / Self Care | Payer: Self-pay | Attending: Emergency Medicine | Admitting: Emergency Medicine

## 2017-11-22 ENCOUNTER — Emergency Department (HOSPITAL_COMMUNITY): Payer: Self-pay

## 2017-11-22 ENCOUNTER — Other Ambulatory Visit: Payer: Self-pay

## 2017-11-22 ENCOUNTER — Encounter (HOSPITAL_COMMUNITY): Payer: Self-pay | Admitting: *Deleted

## 2017-11-22 DIAGNOSIS — Z7982 Long term (current) use of aspirin: Secondary | ICD-10-CM | POA: Insufficient documentation

## 2017-11-22 DIAGNOSIS — Y939 Activity, unspecified: Secondary | ICD-10-CM | POA: Insufficient documentation

## 2017-11-22 DIAGNOSIS — Y999 Unspecified external cause status: Secondary | ICD-10-CM | POA: Insufficient documentation

## 2017-11-22 DIAGNOSIS — S0512XA Contusion of eyeball and orbital tissues, left eye, initial encounter: Secondary | ICD-10-CM | POA: Insufficient documentation

## 2017-11-22 DIAGNOSIS — Y929 Unspecified place or not applicable: Secondary | ICD-10-CM | POA: Insufficient documentation

## 2017-11-22 DIAGNOSIS — W500XXA Accidental hit or strike by another person, initial encounter: Secondary | ICD-10-CM | POA: Insufficient documentation

## 2017-11-22 DIAGNOSIS — F17210 Nicotine dependence, cigarettes, uncomplicated: Secondary | ICD-10-CM | POA: Insufficient documentation

## 2017-11-22 DIAGNOSIS — I10 Essential (primary) hypertension: Secondary | ICD-10-CM | POA: Insufficient documentation

## 2017-11-22 MED ORDER — FLUORESCEIN SODIUM 1 MG OP STRP
1.0000 | ORAL_STRIP | Freq: Once | OPHTHALMIC | Status: AC
  Administered 2017-11-23: 1 via OPHTHALMIC
  Filled 2017-11-22: qty 1

## 2017-11-22 MED ORDER — OXYCODONE-ACETAMINOPHEN 5-325 MG PO TABS
1.0000 | ORAL_TABLET | Freq: Once | ORAL | Status: AC
Start: 2017-11-23 — End: 2017-11-23
  Administered 2017-11-23: 1 via ORAL
  Filled 2017-11-22: qty 1

## 2017-11-22 MED ORDER — TETRACAINE HCL 0.5 % OP SOLN
2.0000 [drp] | Freq: Once | OPHTHALMIC | Status: AC
  Administered 2017-11-23: 2 [drp] via OPHTHALMIC
  Filled 2017-11-22: qty 4
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
## 2017-11-23 MED ORDER — HYDROCODONE-ACETAMINOPHEN 5-325 MG PO TABS: 1.0000 | ORAL_TABLET | Freq: Four times a day (QID) | ORAL | 0 refills | Status: DC | PRN

## 2017-11-23 MED ORDER — LORAZEPAM 1 MG PO TABS
1.0000 mg | ORAL_TABLET | Freq: Once | ORAL | Status: AC
  Administered 2017-11-23: 1 mg via ORAL
  Filled 2017-11-23: qty 1

## 2017-11-23 NOTE — Discharge Instructions (Signed)
You  were seen today for contusion to the left eye.  Take pain medication as directed.  Use ice for 20-minute intervals 3-4 times a day.

## 2018-07-27 ENCOUNTER — Emergency Department (HOSPITAL_COMMUNITY)
Admission: EM | Admit: 2018-07-27 | Discharge: 2018-07-27 | Disposition: A | Discharge status: Home / Self Care | Payer: Self-pay | Attending: Emergency Medicine | Admitting: Emergency Medicine

## 2018-07-27 ENCOUNTER — Other Ambulatory Visit: Payer: Self-pay

## 2018-07-27 ENCOUNTER — Encounter (HOSPITAL_COMMUNITY): Payer: Self-pay | Admitting: Emergency Medicine

## 2018-07-27 ENCOUNTER — Emergency Department (HOSPITAL_COMMUNITY): Payer: Self-pay

## 2018-07-27 DIAGNOSIS — F17210 Nicotine dependence, cigarettes, uncomplicated: Secondary | ICD-10-CM | POA: Insufficient documentation

## 2018-07-27 DIAGNOSIS — M25511 Pain in right shoulder: Secondary | ICD-10-CM

## 2018-07-27 DIAGNOSIS — M19011 Primary osteoarthritis, right shoulder: Secondary | ICD-10-CM | POA: Insufficient documentation

## 2018-07-27 DIAGNOSIS — I10 Essential (primary) hypertension: Secondary | ICD-10-CM | POA: Insufficient documentation

## 2018-07-27 MED ORDER — TRAMADOL HCL 50 MG PO TABS: 50.0000 mg | ORAL_TABLET | Freq: Three times a day (TID) | ORAL | 0 refills | Status: AC | PRN

## 2018-07-27 MED ORDER — IBUPROFEN 400 MG PO TABS
400.0000 mg | ORAL_TABLET | Freq: Once | ORAL | Status: AC
  Administered 2018-07-27: 400 mg via ORAL
  Filled 2018-07-27: qty 1

## 2018-07-27 MED ORDER — ACETAMINOPHEN 325 MG PO TABS
650.0000 mg | ORAL_TABLET | Freq: Once | ORAL | Status: AC
  Administered 2018-07-27: 650 mg via ORAL
  Filled 2018-07-27: qty 2

## 2018-07-27 NOTE — Discharge Instructions (Addendum)
It was our pleasure to provide your ER care today - we hope that you feel better.  Take motrin or aleve as need for pain. You may also try ultram as need for pain - no driving when taking.  Follow up with primary care doctor in the next 1-2 weeks. Also have them recheck your blood pressure then, as it is high today.  Return to ER if worse, new symptoms, fevers, intractable pain, other concern.

## 2018-07-27 NOTE — ED Provider Notes (Signed)
[facility] EMERGENCY DEPARTMENT Provider Note   CSN: [id number] Arrival date & time: 07/27/18  1028     History   Chief Complaint Chief Complaint  Patient presents with  . Shoulder Pain    HPI Lauren Colon is a 62 y.o. female.  Pt c/o right shoulder pain for the past couple months. Pain constant, dull, moderate, worse w abducting or trying to raise shoulder, non radiating. Pt notes at times feels popping or crunching in shoulder. Denies specific injury, or abrupt/acute worsening of pain today. No swelling or redness to shoulder or arm. No fever or chills. No neck or radicular pain. Is right hand dominant.   The history is provided by the patient.  Shoulder Pain   Pertinent negatives include no numbness.    Past Medical History:  Diagnosis Date  . Arthritis   . Fibromyalgia   . Hypertension     Patient Active Problem List   Diagnosis Date Noted  . SOB (shortness of breath) 08/12/2015  . Chest pain 07/14/2015    Past Surgical History:  Procedure Laterality Date  . TUBAL LIGATION       OB History   None      Home Medications    Prior to Admission medications   Medication Sig Start Date End Date Taking? Authorizing Provider  albuterol (PROVENTIL HFA;VENTOLIN HFA) 108 (90 BASE) MCG/ACT inhaler Inhale 1-2 puffs into the lungs every 6 (six) hours as needed for wheezing or shortness of breath. For shortness of breath 08/12/15   Laqueta LindenKoneswaran, Suresh A, MD  aspirin 81 MG tablet Take 81 mg by mouth daily.    [provider]  HYDROcodone-acetaminophen (NORCO/VICODIN) 5-325 MG tablet Take 1 tablet by mouth every 6 (six) hours as needed. 11/23/17   Horton, Mayer Maskerourtney F, MD  ibuprofen (ADVIL,MOTRIN) 200 MG tablet Take 200 mg by mouth every 6 (six) hours as needed for moderate pain.    [provider]  nitroGLYCERIN (NITROSTAT) 0.4 MG SL tablet Place 0.4 mg under the tongue every 5 (five) minutes as needed for chest pain.    [provider]    ondansetron (ZOFRAN) 4 MG tablet Take 1 tablet (4 mg total) by mouth every 8 (eight) hours as needed for nausea or vomiting. 06/06/17   Dietrich PatesKhatri, Hina, PA-C    Family History Family History  Problem Relation Age of Onset  . Heart attack Brother   . Heart disease Brother     Social History Social History   Tobacco Use  . Smoking status: Current Every Day Smoker    Packs/day: 0.50    Years: 43.00    Pack years: 21.50    Types: Cigarettes  . Smokeless tobacco: Never Used  . Tobacco comment: started smoking at 62 y/o  Substance Use Topics  . Alcohol use: No    Alcohol/week: 0.0 standard drinks  . Drug use: No     Allergies   Aspirin   Review of Systems Review of Systems  Constitutional: Negative for fever.  Respiratory: Negative for shortness of breath.   Cardiovascular: Negative for chest pain.  Musculoskeletal: Negative for neck pain.  Skin: Negative for rash and wound.  Neurological: Negative for weakness and numbness.     Physical Exam Updated Vital Signs BP (!) 161/92 (BP Location: Left Arm)   Pulse 91   Temp 98.1 F (36.7 C) (Oral)   Resp 18   Ht 1.626 m (5\' 4" )   Wt 59 kg   SpO2 99%   BMI  22.31 kg/m   Physical Exam  Constitutional: She appears well-developed and well-nourished.  HENT:  Head: Atraumatic.  Eyes: Conjunctivae are normal. No scleral icterus.  Neck: Neck supple. No tracheal deviation present.  Cardiovascular: Normal rate and intact distal pulses.  Pulmonary/Chest: Effort normal. No respiratory distress.  Abdominal: Normal appearance.  Musculoskeletal: She exhibits no edema.  Tenderness right shoulder, and pain w active abduction. Good passive rom shoulder without pain. Radial pulse 2+. No arm swelling.   Neurological: She is alert.  RUE rad/med/uln fxn, motor and sens, intact.   Skin: Skin is warm and dry. No rash noted.  Psychiatric: She has a normal mood and affect.  Nursing note and vitals reviewed.    ED Treatments / Results   Labs (all labs ordered are listed, but only abnormal results are displayed) Labs Reviewed - No data to display  EKG None  Radiology Dg Shoulder Right  Result Date: 07/27/2018 CLINICAL DATA:  Bilateral shoulder pain for months. The pain is worse on the right. No reported injury. EXAM: RIGHT SHOULDER - 2+ VIEW COMPARISON:  None. FINDINGS: Minimal inferior glenohumeral spur formation and mild to moderate inferior acromioclavicular spur formation. No fracture or dislocation seen. Cervical spine degenerative changes. IMPRESSION: Mild-to-moderate degenerative changes. Electronically Signed   By: Beckie Salts M.D.   On: 07/27/2018 11:28    Procedures Procedures (including critical care time)  Medications Ordered in ED Medications - No data to display   Initial Impression / Assessment and Plan / ED Course  I have reviewed the triage vital signs and the nursing notes.  Pertinent labs & imaging results that were available during my care of the patient were reviewed by me and considered in my medical decision making (see chart for details).  Xrays.  Acetaminophen po. Motrin po.   xrays reviewed - no fx, +arthritic changes.   No sign of infection or septic joint. Afeb. Symptoms present for past couple months. Pt currently appears stable for d/c.     Final Clinical Impressions(s) / ED Diagnoses   Final diagnoses:  None    ED Discharge Orders    None       Cathren Laine, MD 07/27/18 1132

## 2018-07-27 NOTE — ED Triage Notes (Signed)
Patient c/o bilateral shoulder pain for months that is progressively getting worse. Denies any injury. Per patient right shoulder worse than left. Per patient "fever in it at times." Pain worse with movement. Patient taking advil with some relief.

## 2020-08-24 ENCOUNTER — Other Ambulatory Visit (HOSPITAL_COMMUNITY): Payer: Self-pay | Admitting: Internal Medicine

## 2020-08-24 DIAGNOSIS — Z1231 Encounter for screening mammogram for malignant neoplasm of breast: Secondary | ICD-10-CM

## 2020-09-14 ENCOUNTER — Encounter (HOSPITAL_COMMUNITY): Payer: Self-pay

## 2020-09-14 ENCOUNTER — Ambulatory Visit (HOSPITAL_COMMUNITY)
Admission: RE | Admit: 2020-09-14 | Discharge: 2020-09-14 | Disposition: A | Discharge status: Home / Self Care | Payer: Self-pay | Source: Ambulatory Visit | Attending: Internal Medicine | Admitting: Internal Medicine

## 2020-09-14 ENCOUNTER — Other Ambulatory Visit: Payer: Self-pay

## 2020-09-14 DIAGNOSIS — Z1231 Encounter for screening mammogram for malignant neoplasm of breast: Secondary | ICD-10-CM | POA: Insufficient documentation

## 2021-04-10 ENCOUNTER — Other Ambulatory Visit (HOSPITAL_COMMUNITY): Payer: Self-pay | Admitting: Internal Medicine

## 2021-04-10 DIAGNOSIS — R011 Cardiac murmur, unspecified: Secondary | ICD-10-CM

## 2021-05-09 ENCOUNTER — Other Ambulatory Visit: Payer: Self-pay

## 2021-05-09 ENCOUNTER — Other Ambulatory Visit (HOSPITAL_COMMUNITY)
Admission: RE | Admit: 2021-05-09 | Discharge: 2021-05-09 | Disposition: A | Discharge status: Home / Self Care | Payer: Medicare Other | Source: Ambulatory Visit | Attending: Internal Medicine | Admitting: Internal Medicine

## 2021-05-09 ENCOUNTER — Ambulatory Visit (HOSPITAL_COMMUNITY): Admission: RE | Admit: 2021-05-09 | Payer: Medicare Other | Source: Ambulatory Visit

## 2021-05-09 DIAGNOSIS — I10 Essential (primary) hypertension: Secondary | ICD-10-CM | POA: Diagnosis not present

## 2021-05-09 DIAGNOSIS — Z79899 Other long term (current) drug therapy: Secondary | ICD-10-CM | POA: Diagnosis not present

## 2021-05-09 DIAGNOSIS — Z0001 Encounter for general adult medical examination with abnormal findings: Secondary | ICD-10-CM | POA: Insufficient documentation

## 2021-05-09 LAB — BASIC METABOLIC PANEL
Anion gap: 10 (ref 5–15)
BUN: 20 mg/dL (ref 8–23)
CO2: 24 mmol/L (ref 22–32)
Calcium: 9.2 mg/dL (ref 8.9–10.3)
Chloride: 104 mmol/L (ref 98–111)
Creatinine, Ser: 0.82 mg/dL (ref 0.44–1.00)
GFR, Estimated: >60 mL/min (ref >60)
Glucose, Bld: 111 mg/dL — ABNORMAL HIGH (ref 70–99)
Potassium: 3.3 mmol/L — ABNORMAL LOW (ref 3.5–5.1)
Sodium: 138 mmol/L (ref 135–145)

## 2021-05-09 LAB — HEPATIC FUNCTION PANEL
ALT: 11 U/L (ref 0–44)
AST: 18 U/L (ref 15–41)
Albumin: 3.9 g/dL (ref 3.5–5.0)
Alkaline Phosphatase: 73 U/L (ref 38–126)
Bilirubin, Direct: <0.1 mg/dL (ref 0.0–0.2)
Total Bilirubin: 0.4 mg/dL (ref 0.3–1.2)
Total Protein: 7.3 g/dL (ref 6.5–8.1)

## 2021-05-09 LAB — LIPID PANEL
Cholesterol: 225 mg/dL — ABNORMAL HIGH (ref 0–200)
HDL: 70 mg/dL (ref >40)
LDL Cholesterol: 141 mg/dL — ABNORMAL HIGH (ref 0–99)
Total CHOL/HDL Ratio: 3.2 RATIO
Triglycerides: 70 mg/dL (ref <150)
VLDL: 14 mg/dL (ref 0–40)

## 2021-05-09 LAB — CBC WITH DIFFERENTIAL/PLATELET
Abs Immature Granulocytes: 0.03 10*3/uL (ref 0.00–0.07)
Basophils Absolute: 0 10*3/uL (ref 0.0–0.1)
Basophils Relative: 0 %
Eosinophils Absolute: 0.2 10*3/uL (ref 0.0–0.5)
Eosinophils Relative: 1 %
HCT: 40.1 % (ref 36.0–46.0)
Hemoglobin: 12.9 g/dL (ref 12.0–15.0)
Immature Granulocytes: 0 %
Lymphocytes Relative: 17 %
Lymphs Abs: 2.2 10*3/uL (ref 0.7–4.0)
MCH: 29.8 pg (ref 26.0–34.0)
MCHC: 32.2 g/dL (ref 30.0–36.0)
MCV: 92.6 fL (ref 80.0–100.0)
Monocytes Absolute: 0.6 10*3/uL (ref 0.1–1.0)
Monocytes Relative: 4 %
Neutro Abs: 10.1 10*3/uL — ABNORMAL HIGH (ref 1.7–7.7)
Neutrophils Relative %: 78 %
Platelets: 334 10*3/uL (ref 150–400)
RBC: 4.33 MIL/uL (ref 3.87–5.11)
RDW: 14.5 % (ref 11.5–15.5)
WBC: 13.1 10*3/uL — ABNORMAL HIGH (ref 4.0–10.5)
nRBC: 0 % (ref 0.0–0.2)

## 2021-05-09 LAB — TSH: TSH: 1.053 u[IU]/mL (ref 0.350–4.500)

## 2021-05-10 LAB — T4: T4, Total: 7.6 ug/dL (ref 4.5–12.0)

## 2021-05-10 LAB — HEMOGLOBIN A1C
Hgb A1c MFr Bld: 5.2 % (ref 4.8–5.6)
Mean Plasma Glucose: 103 mg/dL

## 2021-07-06 ENCOUNTER — Other Ambulatory Visit (HOSPITAL_COMMUNITY)
Admission: RE | Admit: 2021-07-06 | Discharge: 2021-07-06 | Disposition: A | Discharge status: Home / Self Care | Payer: Medicare Other | Source: Ambulatory Visit | Attending: Internal Medicine | Admitting: Internal Medicine

## 2021-07-06 ENCOUNTER — Ambulatory Visit (HOSPITAL_COMMUNITY)
Admission: RE | Admit: 2021-07-06 | Discharge: 2021-07-06 | Disposition: A | Discharge status: Home / Self Care | Payer: Medicare Other | Source: Ambulatory Visit | Attending: Gerontology | Admitting: Gerontology

## 2021-07-06 ENCOUNTER — Other Ambulatory Visit: Payer: Self-pay

## 2021-07-06 ENCOUNTER — Other Ambulatory Visit (HOSPITAL_COMMUNITY): Payer: Self-pay | Admitting: Gerontology

## 2021-07-06 ENCOUNTER — Other Ambulatory Visit (HOSPITAL_COMMUNITY): Payer: Self-pay | Admitting: Internal Medicine

## 2021-07-06 DIAGNOSIS — M545 Low back pain, unspecified: Secondary | ICD-10-CM | POA: Insufficient documentation

## 2021-07-06 DIAGNOSIS — Z1231 Encounter for screening mammogram for malignant neoplasm of breast: Secondary | ICD-10-CM

## 2021-07-06 DIAGNOSIS — F17200 Nicotine dependence, unspecified, uncomplicated: Secondary | ICD-10-CM | POA: Diagnosis not present

## 2021-07-06 DIAGNOSIS — E782 Mixed hyperlipidemia: Secondary | ICD-10-CM | POA: Diagnosis not present

## 2021-07-06 DIAGNOSIS — I10 Essential (primary) hypertension: Secondary | ICD-10-CM | POA: Insufficient documentation

## 2021-07-06 LAB — BASIC METABOLIC PANEL
Anion gap: 9 (ref 5–15)
BUN: 20 mg/dL (ref 8–23)
CO2: 25 mmol/L (ref 22–32)
Calcium: 9 mg/dL (ref 8.9–10.3)
Chloride: 104 mmol/L (ref 98–111)
Creatinine, Ser: 0.78 mg/dL (ref 0.44–1.00)
GFR, Estimated: >60 mL/min (ref >60)
Glucose, Bld: 96 mg/dL (ref 70–99)
Potassium: 3.7 mmol/L (ref 3.5–5.1)
Sodium: 138 mmol/L (ref 135–145)

## 2021-07-25 LAB — COLOGUARD

## 2021-08-01 ENCOUNTER — Ambulatory Visit: Payer: Medicare Other

## 2021-08-01 ENCOUNTER — Encounter: Payer: Self-pay | Admitting: Orthopedic Surgery

## 2021-08-01 ENCOUNTER — Ambulatory Visit: Payer: Medicare Other | Admitting: Orthopedic Surgery

## 2021-08-01 ENCOUNTER — Other Ambulatory Visit: Payer: Self-pay

## 2021-08-01 VITALS — BP 159/94 | HR 86 | Ht 64.0 in | Wt 136.0 lb

## 2021-08-01 DIAGNOSIS — M1611 Unilateral primary osteoarthritis, right hip: Secondary | ICD-10-CM

## 2021-08-01 DIAGNOSIS — Z1211 Encounter for screening for malignant neoplasm of colon: Secondary | ICD-10-CM | POA: Diagnosis not present

## 2021-08-01 DIAGNOSIS — M25551 Pain in right hip: Secondary | ICD-10-CM

## 2021-08-01 DIAGNOSIS — Z1212 Encounter for screening for malignant neoplasm of rectum: Secondary | ICD-10-CM | POA: Diagnosis not present

## 2021-08-01 MED ORDER — DICLOFENAC SODIUM 50 MG PO TBEC: 50.0000 mg | DELAYED_RELEASE_TABLET | Freq: Two times a day (BID) | ORAL | 2 refills | Status: DC

## 2021-08-01 NOTE — Progress Notes (Signed)
New Patient Visit  Assessment: Lauren Colon is a 65 y.o. female with the following: Right hip arthritis Low back pain with right-sided sciatica; chronic  Plan: Patient has chronic lower back pain, with radiculopathy into her right foot.  She states this is been ongoing for years.  Recent x-rays demonstrate no obvious pathology.  Limited degenerative changes.  No anterolisthesis.  However, she is also having pain in the right groin, radiating distally into her knee.  Radiographs today demonstrates degenerative changes, including osteophyte formation, and subchondral sclerosis within the acetabulum.  She has limited internal rotation, and this recreates her pain.  We discussed multiple options to help with this pain, including medications, physical therapy and consideration for an injection.  She is not interested in an injection, but has elected to proceed with a prescription medication.  This was provided for her today.  She will let me know if she has any issues in the future.  Follow-up as needed.   Follow-up: Return if symptoms worsen or fail to improve.  Subjective:  Chief Complaint  Patient presents with   Back Pain    Mid-lower back pain that is going down to her toes. States the right > left.     History of Present Illness: Lauren Colon is a 65 y.o. female who presents for evaluation of low back pain.  She states she has had lower back pain, with pain radiating into her right toe for many years.  This is unchanged.  More recently, she started develop pain in the front of her thigh, as well as her right groin.  She occasionally takes over-the-counter pain medications like ibuprofen.  However, she does not take this on a regular basis.  She reports that she takes a herbal medication, which does alleviate her symptoms almost instantly.  However, she has had some difficulty getting this medication recently.  She has not worked with physical therapist.  No previous injections.  She  presented to the emergency department recently due to worsening pain.  X-rays at that time did not demonstrate significant pathology.   Review of Systems: No fevers or chills No numbness or tingling No chest pain No shortness of breath No bowel or bladder dysfunction No GI distress No headaches   Medical History:  Past Medical History:  Diagnosis Date   Arthritis    Fibromyalgia    Hypertension     Past Surgical History:  Procedure Laterality Date   TUBAL LIGATION      Family History  Problem Relation Age of Onset   Heart attack Brother    Heart disease Brother    Breast cancer Sister    Breast cancer Sister    Breast cancer Sister    Social History   Tobacco Use   Smoking status: Every Day    Packs/day: 0.50    Years: 43.00    Pack years: 21.50    Types: Cigarettes   Smokeless tobacco: Never   Tobacco comments:    started smoking at 65 y/o  Vaping Use   Vaping Use: Never used  Substance Use Topics   Alcohol use: No    Alcohol/week: 0.0 standard drinks   Drug use: No    Allergies  Allergen Reactions   Aspirin Nausea Only    Current Meds  Medication Sig   aspirin 81 MG tablet Take 81 mg by mouth daily.   diclofenac (VOLTAREN) 50 MG EC tablet Take 1 tablet (50 mg total) by mouth 2 (two) times daily.  losartan-hydrochlorothiazide (HYZAAR) 50-12.5 MG tablet Take 1 tablet by mouth daily.   simvastatin (ZOCOR) 20 MG tablet Take 20 mg by mouth at bedtime.    Objective: BP (!) 159/94   Pulse 86   Ht 5\' 4"  (1.626 m)   Wt 136 lb (61.7 kg)   BMI 23.34 kg/m   Physical Exam:  General: Alert and oriented. and No acute distress. Gait: Right sided antalgic gait.  Evaluation of lower back demonstrates no deformity.  Mild tenderness to palpation.  Negative straight leg raise.  Tolerates flexion to approximately 90 degrees.  Limited internal rotation, which causes pain in her groin area.  She does have some tenderness to palpation around the knee as well.   Toes are warm and well-perfused.  Sensation is intact distally.  Active motion in the great toe and ankle is intact.  Strength is 5/5 throughout right lower extremity.  IMAGING: I personally ordered and reviewed the following images  X-rays of the right hip, including an AP pelvis were obtained in clinic today.  She has some joint space remaining in the right hip, but there is subchondral sclerosis within the acetabulum.  There is also osteophyte formation about the superior and inferior aspect of the femoral head.  Compared to the contralateral side, moderate degenerative changes in the right hip.  Impression: Moderate right hip arthritis   X-rays of the lumbar spine were previously obtained in the emergency department and demonstrates no evidence of listhesis.  No significant scoliosis is appreciated.  Well-maintained disc height.   New Medications:  Meds ordered this encounter  Medications   diclofenac (VOLTAREN) 50 MG EC tablet    Sig: Take 1 tablet (50 mg total) by mouth 2 (two) times daily.    Dispense:  60 tablet    Refill:  2      , MD  08/01/2021 4:25 PM

## 2021-08-06 DIAGNOSIS — I10 Essential (primary) hypertension: Secondary | ICD-10-CM | POA: Diagnosis not present

## 2021-08-06 DIAGNOSIS — F17200 Nicotine dependence, unspecified, uncomplicated: Secondary | ICD-10-CM | POA: Diagnosis not present

## 2021-08-06 LAB — COLOGUARD: COLOGUARD: POSITIVE — AB

## 2021-09-06 DIAGNOSIS — I10 Essential (primary) hypertension: Secondary | ICD-10-CM | POA: Diagnosis not present

## 2021-09-06 DIAGNOSIS — E782 Mixed hyperlipidemia: Secondary | ICD-10-CM | POA: Diagnosis not present

## 2021-09-18 ENCOUNTER — Ambulatory Visit (HOSPITAL_COMMUNITY): Payer: Medicare Other

## 2021-09-21 ENCOUNTER — Ambulatory Visit (HOSPITAL_COMMUNITY): Payer: Medicare Other

## 2021-09-21 ENCOUNTER — Encounter: Payer: Self-pay | Admitting: Internal Medicine

## 2021-10-04 ENCOUNTER — Other Ambulatory Visit: Payer: Self-pay

## 2021-10-04 ENCOUNTER — Encounter (HOSPITAL_COMMUNITY): Payer: Self-pay

## 2021-10-04 ENCOUNTER — Ambulatory Visit (HOSPITAL_COMMUNITY)
Admission: RE | Admit: 2021-10-04 | Discharge: 2021-10-04 | Disposition: A | Discharge status: Home / Self Care | Payer: Medicare Other | Source: Ambulatory Visit | Attending: Internal Medicine | Admitting: Internal Medicine

## 2021-10-04 DIAGNOSIS — Z1231 Encounter for screening mammogram for malignant neoplasm of breast: Secondary | ICD-10-CM | POA: Insufficient documentation

## 2021-10-06 DIAGNOSIS — I10 Essential (primary) hypertension: Secondary | ICD-10-CM | POA: Diagnosis not present

## 2021-10-06 DIAGNOSIS — F17200 Nicotine dependence, unspecified, uncomplicated: Secondary | ICD-10-CM | POA: Diagnosis not present

## 2021-11-06 DIAGNOSIS — I10 Essential (primary) hypertension: Secondary | ICD-10-CM | POA: Diagnosis not present

## 2021-11-06 DIAGNOSIS — F17200 Nicotine dependence, unspecified, uncomplicated: Secondary | ICD-10-CM | POA: Diagnosis not present

## 2021-11-19 NOTE — Progress Notes (Signed)
Cardiology Office Note:    Date:  11/23/2021   ID:  Lauren Colon, DOB 08-23-1956, MRN [id number]  PCP:  Avon Gully, MD  Cardiologist:  Little Ishikawa, MD  Electrophysiologist:  None   Referring MD: Avon Gully, MD   Chief Complaint  Patient presents with   Chest Pain    History of Present Illness:    Lauren Colon is a 65 y.o. female with a hx of hypertension, tobacco use, fibromyalgia who is referred by Dr. Felecia Shelling for evaluation of heart murmur.  She previously followed with Dr. Purvis Sheffield, last seen 08/12/2015.  She had been having chest pain and nuclear stress test was done which showed small mild defect in basal inferoseptal and mid inferoseptal regions with possible mild ischemia versus artifact, low risk study.  She reports that she has been having tightness in her chest, occurs about once per week.  Describes tightness in center of her chest, lasts for 5 to 10 minutes.  Does occur when she exerts herself.  Also occurs when she is stressed.  Resolves with rest.  Also reports feeling short of breath with exertion.  Has been having some lightheadedness but denies any syncope.  Denies any lower extremity edema.  She monitors her blood pressure at home, reports it has been normal.  She is smoking 0.5 packs/day.  No known history of heart disease in her family.    Past Medical History:  Diagnosis Date   Arthritis    Fibromyalgia    Hypertension     Past Surgical History:  Procedure Laterality Date   TUBAL LIGATION      Current Medications: Current Meds  Medication Sig   aspirin 81 MG tablet Take 81 mg by mouth daily.   diclofenac (VOLTAREN) 50 MG EC tablet Take 1 tablet (50 mg total) by mouth 2 (two) times daily.   losartan-hydrochlorothiazide (HYZAAR) 50-12.5 MG tablet Take 1 tablet by mouth daily.   metoprolol tartrate (LOPRESSOR) 100 MG tablet Take 100 mg 2 hours before cardiac ct   simvastatin (ZOCOR) 20 MG tablet Take 20 mg by mouth at bedtime.    traMADol (ULTRAM) 50 MG tablet Take 1 tablet (50 mg total) by mouth 3 (three) times daily as needed.     Allergies:   Aspirin   Social History   Socioeconomic History   Marital status: Married    Spouse name: Not on file   Number of children: Not on file   Years of education: Not on file   Highest education level: Not on file  Occupational History   Not on file  Tobacco Use   Smoking status: Every Day    Packs/day: 0.50    Years: 43.00    Pack years: 21.50    Types: Cigarettes   Smokeless tobacco: Never   Tobacco comments:    started smoking at 65 y/o  Vaping Use   Vaping Use: Never used  Substance and Sexual Activity   Alcohol use: No    Alcohol/week: 0.0 standard drinks   Drug use: No   Sexual activity: Not on file  Other Topics Concern   Not on file  Social History Narrative   Not on file   Social Determinants of Health   Financial Resource Strain: Not on file  Food Insecurity: Not on file  Transportation Needs: Not on file  Physical Activity: Not on file  Stress: Not on file  Social Connections: Not on file     Family History: The patient's family  history includes Breast cancer in her sister, sister, and sister; Heart attack in her brother; Heart disease in her brother.  ROS:   Please see the history of present illness.     All other systems reviewed and are negative.  EKGs/Labs/Other Studies Reviewed:    The following studies were reviewed today:   EKG:  EKG is  ordered today.  The ekg ordered today demonstrates normal sinus rhythm, rate 75, Q waves in V1/2  Recent Labs: 05/09/2021: ALT 11; Hemoglobin 12.9; Platelets 334; TSH 1.053 07/06/2021: BUN 20; Creatinine, Ser 0.78; Potassium 3.7; Sodium 138  Recent Lipid Panel    Component Value Date/Time   CHOL 225 (H) 05/09/2021 0843   TRIG 70 05/09/2021 0843   HDL 70 05/09/2021 0843   CHOLHDL 3.2 05/09/2021 0843   VLDL 14 05/09/2021 0843   LDLCALC 141 (H) 05/09/2021 0843   LDLDIRECT 112 (H)  01/19/2014 1519    Physical Exam:    VS:  BP 134/82   Pulse 75   Ht 5\' 2"  (1.575 m)   Wt 139 lb (63 kg)   SpO2 98%   BMI 25.42 kg/m     Wt Readings from Last 3 Encounters:  11/23/21 139 lb (63 kg)  08/01/21 136 lb (61.7 kg)  07/27/18 130 lb (59 kg)     GEN:  Well nourished, well developed in no acute distress HEENT: Normal NECK: No JVD; No carotid bruits LYMPHATICS: No lymphadenopathy CARDIAC: RRR, subtle systolic murmur RESPIRATORY:  Clear to auscultation without rales, wheezing or rhonchi  ABDOMEN: Soft, non-tender, non-distended MUSCULOSKELETAL:  No edema; No deformity  SKIN: Warm and dry NEUROLOGIC:  Alert and oriented x 3 PSYCHIATRIC:  Normal affect   ASSESSMENT:    1. Chest pain, unspecified type   2. Cardiac murmur   3. Essential hypertension   4. Hyperlipidemia, unspecified hyperlipidemia type   5. Tobacco use    PLAN:     Chest pain: Description concerning for typical angina, as describes chest tightness with exertion/stress that resolves with rest.  She does have significant CAD risk factors (hypertension, hyperlipidemia, tobacco use) -Recommend coronary CTA for evaluation for obstructive CAD.  We will give metoprolol 100 mg prior to study -Echocardiogram  Heart murmur: Subtle systolic murmur, will check echocardiogram  Hypertension: On losartan-HCTZ 50-12.5 mg daily.  Appears controlled  Hyperlipidemia: On simvastatin 20 mg.  LDL 141 on 05/09/2021.  We will follow-up results of coronary CTA to guide how aggressive to be in lowering cholesterol  Tobacco use: Patient counseled risk of tobacco use and cessation strongly recommended  RTC in 6 months   Medication Adjustments/Labs and Tests Ordered: Current medicines are reviewed at length with the patient today.  Concerns regarding medicines are outlined above.  Orders Placed This Encounter  Procedures   CT CORONARY MORPH W/CTA COR W/SCORE W/CA W/CM &/OR WO/CM   Basic metabolic panel   EKG 12-Lead    ECHOCARDIOGRAM COMPLETE   Meds ordered this encounter  Medications   metoprolol tartrate (LOPRESSOR) 100 MG tablet    Sig: Take 100 mg 2 hours before cardiac ct    Dispense:  1 tablet    Refill:  0    Patient Instructions  Medication Instructions:  Take Lopressor 100 mg 2 hours before cardiac ct  Labwork: BMET 1 week before CT  Testing/Procedures: Your physician has requested that you have an echocardiogram. Echocardiography is a painless test that uses sound waves to create images of your heart. It provides your doctor with information  about the size and shape of your heart and how well your heart's chambers and valves are working. This procedure takes approximately one hour. There are no restrictions for this procedure.   Your physician has requested that you have cardiac CT. Cardiac computed tomography (CT) is a painless test that uses an x-ray machine to take clear, detailed pictures of your heart. For further information please visit [email or web address] Please follow instruction sheet as given.    Follow-Up: 6 months  Any Other Special Instructions Will Be Listed Below (If Applicable).  If you need a refill on your cardiac medications before your next appointment, please call your pharmacy.   Signed, Little Ishikawa, MD  11/23/2021 12:14 PM    [facility]

## 2021-11-23 ENCOUNTER — Ambulatory Visit: Payer: Medicare Other | Admitting: Cardiology

## 2021-11-23 ENCOUNTER — Encounter: Payer: Self-pay | Admitting: Cardiology

## 2021-11-23 VITALS — BP 134/82 | HR 75 | Ht 62.0 in | Wt 139.0 lb

## 2021-11-23 DIAGNOSIS — R011 Cardiac murmur, unspecified: Secondary | ICD-10-CM | POA: Diagnosis not present

## 2021-11-23 DIAGNOSIS — Z720 Tobacco use: Secondary | ICD-10-CM

## 2021-11-23 DIAGNOSIS — R079 Chest pain, unspecified: Secondary | ICD-10-CM

## 2021-11-23 DIAGNOSIS — E785 Hyperlipidemia, unspecified: Secondary | ICD-10-CM | POA: Diagnosis not present

## 2021-11-23 DIAGNOSIS — I10 Essential (primary) hypertension: Secondary | ICD-10-CM

## 2021-11-23 MED ORDER — METOPROLOL TARTRATE 100 MG PO TABS: ORAL_TABLET | ORAL | 0 refills | Status: DC

## 2021-11-23 NOTE — Patient Instructions (Signed)
Medication Instructions:  Take Lopressor 100 mg 2 hours before cardiac ct  Labwork: BMET 1 week before CT  Testing/Procedures: Your physician has requested that you have an echocardiogram. Echocardiography is a painless test that uses sound waves to create images of your heart. It provides your doctor with information about the size and shape of your heart and how well your heart's chambers and valves are working. This procedure takes approximately one hour. There are no restrictions for this procedure.   Your physician has requested that you have cardiac CT. Cardiac computed tomography (CT) is a painless test that uses an x-ray machine to take clear, detailed pictures of your heart. For further information please visit [email or web address] Please follow instruction sheet as given.    Follow-Up: 6 months  Any Other Special Instructions Will Be Listed Below (If Applicable).  If you need a refill on your cardiac medications before your next appointment, please call your pharmacy.

## 2021-12-06 ENCOUNTER — Other Ambulatory Visit: Payer: Self-pay

## 2021-12-06 ENCOUNTER — Other Ambulatory Visit (HOSPITAL_COMMUNITY)
Admission: RE | Admit: 2021-12-06 | Discharge: 2021-12-06 | Disposition: A | Discharge status: Home / Self Care | Payer: Medicare Other | Source: Ambulatory Visit | Attending: Cardiology | Admitting: Cardiology

## 2021-12-06 DIAGNOSIS — R079 Chest pain, unspecified: Secondary | ICD-10-CM | POA: Insufficient documentation

## 2021-12-06 DIAGNOSIS — I10 Essential (primary) hypertension: Secondary | ICD-10-CM | POA: Diagnosis not present

## 2021-12-06 DIAGNOSIS — M545 Low back pain, unspecified: Secondary | ICD-10-CM | POA: Diagnosis not present

## 2021-12-06 LAB — BASIC METABOLIC PANEL
Anion gap: 9 (ref 5–15)
BUN: 17 mg/dL (ref 8–23)
CO2: 26 mmol/L (ref 22–32)
Calcium: 9.2 mg/dL (ref 8.9–10.3)
Chloride: 103 mmol/L (ref 98–111)
Creatinine, Ser: 0.73 mg/dL (ref 0.44–1.00)
GFR, Estimated: >60 mL/min (ref >60)
Glucose, Bld: 101 mg/dL — ABNORMAL HIGH (ref 70–99)
Potassium: 3.7 mmol/L (ref 3.5–5.1)
Sodium: 138 mmol/L (ref 135–145)

## 2021-12-07 ENCOUNTER — Encounter: Payer: Self-pay | Admitting: *Deleted

## 2021-12-12 ENCOUNTER — Telehealth (HOSPITAL_COMMUNITY): Payer: Self-pay | Admitting: Emergency Medicine

## 2021-12-12 NOTE — Telephone Encounter (Signed)
Reaching out to patient to offer assistance regarding upcoming cardiac imaging study; pt verbalizes understanding of appt date/time, parking situation and where to check in, pre-test NPO status and medications ordered, and verified current allergies; name and call back number provided for further questions should they arise Rockwell Alexandria RN Navigator Cardiac Imaging [facility] [phone] office [phone] cell  100mg  metoprolol  Denies iv issues Arrival 1030

## 2021-12-13 ENCOUNTER — Ambulatory Visit (HOSPITAL_COMMUNITY)
Admission: RE | Admit: 2021-12-13 | Discharge: 2021-12-13 | Disposition: A | Discharge status: Home / Self Care | Payer: Medicare Other | Source: Ambulatory Visit | Attending: Cardiology | Admitting: Cardiology

## 2021-12-13 ENCOUNTER — Other Ambulatory Visit: Payer: Self-pay

## 2021-12-13 ENCOUNTER — Other Ambulatory Visit: Payer: Self-pay | Admitting: Internal Medicine

## 2021-12-13 DIAGNOSIS — J439 Emphysema, unspecified: Secondary | ICD-10-CM | POA: Diagnosis not present

## 2021-12-13 DIAGNOSIS — R079 Chest pain, unspecified: Secondary | ICD-10-CM | POA: Insufficient documentation

## 2021-12-13 DIAGNOSIS — R931 Abnormal findings on diagnostic imaging of heart and coronary circulation: Secondary | ICD-10-CM

## 2021-12-13 DIAGNOSIS — I700 Atherosclerosis of aorta: Secondary | ICD-10-CM | POA: Diagnosis not present

## 2021-12-13 DIAGNOSIS — I2510 Atherosclerotic heart disease of native coronary artery without angina pectoris: Secondary | ICD-10-CM | POA: Diagnosis not present

## 2021-12-13 MED ORDER — NITROGLYCERIN 0.4 MG SL SUBL
SUBLINGUAL_TABLET | SUBLINGUAL | Status: AC
  Filled 2021-12-13: qty 2

## 2021-12-13 MED ORDER — NITROGLYCERIN 0.4 MG SL SUBL
0.8000 mg | SUBLINGUAL_TABLET | Freq: Once | SUBLINGUAL | Status: AC
  Administered 2021-12-13: 11:00:00 0.8 mg via SUBLINGUAL

## 2021-12-13 MED ORDER — IOHEXOL 350 MG/ML SOLN
100.0000 mL | Freq: Once | INTRAVENOUS | Status: AC | PRN
  Administered 2021-12-13: 11:00:00 100 mL via INTRAVENOUS

## 2021-12-13 NOTE — Progress Notes (Signed)
Please send for FFR - DR. Farrie Sann 

## 2021-12-14 ENCOUNTER — Other Ambulatory Visit: Payer: Self-pay

## 2021-12-14 ENCOUNTER — Ambulatory Visit (HOSPITAL_COMMUNITY)
Admission: RE | Admit: 2021-12-14 | Discharge: 2021-12-14 | Disposition: A | Discharge status: Home / Self Care | Payer: Medicare Other | Source: Ambulatory Visit | Attending: Internal Medicine | Admitting: Internal Medicine

## 2021-12-14 DIAGNOSIS — R931 Abnormal findings on diagnostic imaging of heart and coronary circulation: Secondary | ICD-10-CM

## 2021-12-14 MED ORDER — ROSUVASTATIN CALCIUM 20 MG PO TABS: 20.0000 mg | ORAL_TABLET | Freq: Every day | ORAL | 3 refills | Status: DC

## 2021-12-26 ENCOUNTER — Telehealth: Payer: Self-pay | Admitting: Cardiology

## 2021-12-26 DIAGNOSIS — R69 Illness, unspecified: Secondary | ICD-10-CM | POA: Diagnosis not present

## 2021-12-26 DIAGNOSIS — F17200 Nicotine dependence, unspecified, uncomplicated: Secondary | ICD-10-CM | POA: Diagnosis not present

## 2021-12-26 DIAGNOSIS — I10 Essential (primary) hypertension: Secondary | ICD-10-CM | POA: Diagnosis not present

## 2021-12-26 DIAGNOSIS — E782 Mixed hyperlipidemia: Secondary | ICD-10-CM | POA: Diagnosis not present

## 2021-12-26 DIAGNOSIS — F17210 Nicotine dependence, cigarettes, uncomplicated: Secondary | ICD-10-CM | POA: Diagnosis not present

## 2021-12-26 DIAGNOSIS — R202 Paresthesia of skin: Secondary | ICD-10-CM | POA: Diagnosis not present

## 2021-12-26 NOTE — Telephone Encounter (Signed)
Dr. Letitia Neri office called they would like that last office visit faxed over to them. Fax number [phone].

## 2021-12-26 NOTE — Telephone Encounter (Signed)
Office note sent via epic routing

## 2022-01-02 ENCOUNTER — Ambulatory Visit (HOSPITAL_COMMUNITY)
Admission: RE | Admit: 2022-01-02 | Discharge: 2022-01-02 | Disposition: A | Discharge status: Home / Self Care | Payer: Medicare HMO | Source: Ambulatory Visit | Attending: Cardiology | Admitting: Cardiology

## 2022-01-02 ENCOUNTER — Other Ambulatory Visit: Payer: Self-pay

## 2022-01-02 DIAGNOSIS — R079 Chest pain, unspecified: Secondary | ICD-10-CM

## 2022-01-02 LAB — ECHOCARDIOGRAM COMPLETE
AR max vel: 2.53 cm2
AV Area VTI: 2.56 cm2
AV Area mean vel: 2.25 cm2
AV Mean grad: 3.5 mmHg
AV Peak grad: 7.6 mmHg
Ao pk vel: 1.38 m/s
Area-P 1/2: 3.03 cm2
S' Lateral: 2.7 cm

## 2022-01-02 NOTE — Progress Notes (Signed)
*  PRELIMINARY RESULTS* Echocardiogram 2D Echocardiogram has been performed.  Stacey Drain 01/02/2022, 2:46 PM

## 2022-01-26 DIAGNOSIS — Z7982 Long term (current) use of aspirin: Secondary | ICD-10-CM | POA: Diagnosis not present

## 2022-01-26 DIAGNOSIS — G8929 Other chronic pain: Secondary | ICD-10-CM | POA: Diagnosis not present

## 2022-01-26 DIAGNOSIS — Z803 Family history of malignant neoplasm of breast: Secondary | ICD-10-CM | POA: Diagnosis not present

## 2022-01-26 DIAGNOSIS — I10 Essential (primary) hypertension: Secondary | ICD-10-CM | POA: Diagnosis not present

## 2022-01-26 DIAGNOSIS — R69 Illness, unspecified: Secondary | ICD-10-CM | POA: Diagnosis not present

## 2022-01-26 DIAGNOSIS — Z8249 Family history of ischemic heart disease and other diseases of the circulatory system: Secondary | ICD-10-CM | POA: Diagnosis not present

## 2022-01-26 DIAGNOSIS — Z833 Family history of diabetes mellitus: Secondary | ICD-10-CM | POA: Diagnosis not present

## 2022-01-26 DIAGNOSIS — Z791 Long term (current) use of non-steroidal anti-inflammatories (NSAID): Secondary | ICD-10-CM | POA: Diagnosis not present

## 2022-01-26 DIAGNOSIS — M1990 Unspecified osteoarthritis, unspecified site: Secondary | ICD-10-CM | POA: Diagnosis not present

## 2022-01-26 DIAGNOSIS — E785 Hyperlipidemia, unspecified: Secondary | ICD-10-CM | POA: Diagnosis not present

## 2022-01-26 DIAGNOSIS — Z008 Encounter for other general examination: Secondary | ICD-10-CM | POA: Diagnosis not present

## 2022-01-26 DIAGNOSIS — Z720 Tobacco use: Secondary | ICD-10-CM | POA: Diagnosis not present

## 2022-02-05 ENCOUNTER — Ambulatory Visit: Payer: Medicare Other | Admitting: Gastroenterology

## 2022-02-22 ENCOUNTER — Other Ambulatory Visit: Payer: Self-pay | Admitting: Orthopedic Surgery

## 2022-02-23 DIAGNOSIS — M519 Unspecified thoracic, thoracolumbar and lumbosacral intervertebral disc disorder: Secondary | ICD-10-CM | POA: Diagnosis not present

## 2022-02-23 DIAGNOSIS — I10 Essential (primary) hypertension: Secondary | ICD-10-CM | POA: Diagnosis not present

## 2022-04-03 DIAGNOSIS — I10 Essential (primary) hypertension: Secondary | ICD-10-CM | POA: Diagnosis not present

## 2022-04-03 DIAGNOSIS — Z1389 Encounter for screening for other disorder: Secondary | ICD-10-CM | POA: Diagnosis not present

## 2022-04-03 DIAGNOSIS — Z0001 Encounter for general adult medical examination with abnormal findings: Secondary | ICD-10-CM | POA: Diagnosis not present

## 2022-04-03 DIAGNOSIS — F17210 Nicotine dependence, cigarettes, uncomplicated: Secondary | ICD-10-CM | POA: Diagnosis not present

## 2022-04-03 DIAGNOSIS — R69 Illness, unspecified: Secondary | ICD-10-CM | POA: Diagnosis not present

## 2022-04-03 DIAGNOSIS — M519 Unspecified thoracic, thoracolumbar and lumbosacral intervertebral disc disorder: Secondary | ICD-10-CM | POA: Diagnosis not present

## 2022-04-03 DIAGNOSIS — E782 Mixed hyperlipidemia: Secondary | ICD-10-CM | POA: Diagnosis not present

## 2022-04-03 DIAGNOSIS — Z1331 Encounter for screening for depression: Secondary | ICD-10-CM | POA: Diagnosis not present

## 2022-04-03 DIAGNOSIS — Z1159 Encounter for screening for other viral diseases: Secondary | ICD-10-CM | POA: Diagnosis not present

## 2022-05-03 DIAGNOSIS — I10 Essential (primary) hypertension: Secondary | ICD-10-CM | POA: Diagnosis not present

## 2022-05-03 DIAGNOSIS — M519 Unspecified thoracic, thoracolumbar and lumbosacral intervertebral disc disorder: Secondary | ICD-10-CM | POA: Diagnosis not present

## 2022-06-03 DIAGNOSIS — M519 Unspecified thoracic, thoracolumbar and lumbosacral intervertebral disc disorder: Secondary | ICD-10-CM | POA: Diagnosis not present

## 2022-06-03 DIAGNOSIS — I10 Essential (primary) hypertension: Secondary | ICD-10-CM | POA: Diagnosis not present

## 2022-06-04 NOTE — Progress Notes (Signed)
Cardiology Office Note:    Date:  06/07/2022   ID:  Lauren Colon, DOB March 21, 1956, MRN [id number]  PCP:  Benetta Spar, MD  Cardiologist:  Little Ishikawa, MD  Electrophysiologist:  None   Referring MD: Benetta Spar*   Chief Complaint  Patient presents with   Coronary Artery Disease    History of Present Illness:    Lauren Colon is a 66 y.o. female with a hx of hypertension, tobacco use, fibromyalgia who presents for follow-up.  She was referred by Dr. Felecia Shelling for evaluation of heart murmur, initially seen 11/23/2021.  She previously followed with Dr. Purvis Sheffield, last seen 08/12/2015.  She had been having chest pain and nuclear stress test was done which showed small mild defect in basal inferoseptal and mid inferoseptal regions with possible mild ischemia versus artifact, low risk study.  She reported that she has been having tightness in her chest, occurs about once per week.  Describes tightness in center of her chest, lasts for 5 to 10 minutes.  Does occur when she exerts herself.  Also occurs when she is stressed.  Resolves with rest.  Also reports feeling short of breath with exertion.  Has been having some lightheadedness but denies any syncope.  Denies any lower extremity edema.  She monitors her blood pressure at home, reports it has been normal.  She is smoking 0.5 packs/day.  No known history of heart disease in her family.  Coronary CTA on 12/05/2021 showed mixed multivessel CAD with at least moderate stenosis in the proximal LAD, calcium score 991 (99th percentile).  CT FFR suggested no significant stenosis (0.86 across lesion in proximal LAD, 0.77 across lesion in distal LAD suggestive of slow tapering) and medical therapy recommended.  Echocardiogram 01/02/2022 showed normal biventricular function, no significant valvular disease.  Since last clinic visit, she reports she has been doing okay.  States that she initially had cramps in her hands when  she started rosuvastatin.  Was initially taking every other day.  States that symptoms have improved and she has been taking daily for the last few months.  Reports chest pain has improved.  Denies any dyspnea, lightheadedness, syncope, or palpitations.  Reports occasional lower extremity edema.  Continues to smoke 0.5 packs/day.   Past Medical History:  Diagnosis Date   Arthritis    Fibromyalgia    Hypertension     Past Surgical History:  Procedure Laterality Date   TUBAL LIGATION      Current Medications: Current Meds  Medication Sig   aspirin 81 MG tablet Take 81 mg by mouth daily.   diclofenac (VOLTAREN) 50 MG EC tablet TAKE 1 TABLET(50 MG) BY MOUTH TWICE DAILY (Patient taking differently: Take 50 mg by mouth 2 (two) times daily.)   losartan-hydrochlorothiazide (HYZAAR) 50-12.5 MG tablet Take 1 tablet by mouth daily.   rosuvastatin (CRESTOR) 20 MG tablet Take 1 tablet (20 mg total) by mouth daily.     Allergies:   Aspirin   Social History   Socioeconomic History   Marital status: Married    Spouse name: Not on file   Number of children: Not on file   Years of education: Not on file   Highest education level: Not on file  Occupational History   Not on file  Tobacco Use   Smoking status: Every Day    Packs/day: 0.50    Years: 43.00    Total pack years: 21.50    Types: Cigarettes   Smokeless tobacco: Never  Tobacco comments:    started smoking at 66 y/o  Vaping Use   Vaping Use: Never used  Substance and Sexual Activity   Alcohol use: No    Alcohol/week: 0.0 standard drinks of alcohol   Drug use: No   Sexual activity: Not on file  Other Topics Concern   Not on file  Social History Narrative   Not on file   Social Determinants of Health   Financial Resource Strain: Not on file  Food Insecurity: Not on file  Transportation Needs: Not on file  Physical Activity: Not on file  Stress: Not on file  Social Connections: Not on file     Family History: The  patient's family history includes Breast cancer in her sister, sister, and sister; Heart attack in her brother; Heart disease in her brother.  ROS:   Please see the history of present illness.     All other systems reviewed and are negative.  EKGs/Labs/Other Studies Reviewed:    The following studies were reviewed today:   EKG:  EKG is  not ordered today.  The ekg ordered at prior clinic visit demonstrates normal sinus rhythm, rate 75, Q waves in V1/2  Recent Labs: 12/06/2021: BUN 17; Creatinine, Ser 0.73; Potassium 3.7; Sodium 138  Recent Lipid Panel    Component Value Date/Time   CHOL 225 (H) 05/09/2021 0843   TRIG 70 05/09/2021 0843   HDL 70 05/09/2021 0843   CHOLHDL 3.2 05/09/2021 0843   VLDL 14 05/09/2021 0843   LDLCALC 141 (H) 05/09/2021 0843   LDLDIRECT 112 (H) 01/19/2014 1519    Physical Exam:    VS:  BP 128/88   Pulse 82   Ht 5\' 2"  (1.575 m)   Wt 141 lb 9.6 oz (64.2 kg)   SpO2 98%   BMI 25.90 kg/m     Wt Readings from Last 3 Encounters:  06/07/22 141 lb 9.6 oz (64.2 kg)  11/23/21 139 lb (63 kg)  08/01/21 136 lb (61.7 kg)     GEN:  Well nourished, well developed in no acute distress HEENT: Normal NECK: No JVD; No carotid bruits LYMPHATICS: No lymphadenopathy CARDIAC: RRR, subtle systolic murmur RESPIRATORY:  Clear to auscultation without rales, wheezing or rhonchi  ABDOMEN: Soft, non-tender, non-distended MUSCULOSKELETAL:  No edema; No deformity  SKIN: Warm and dry NEUROLOGIC:  Alert and oriented x 3 PSYCHIATRIC:  Normal affect   ASSESSMENT:    1. Coronary artery disease involving native coronary artery of native heart without angina pectoris   2. Essential hypertension   3. Hyperlipidemia, unspecified hyperlipidemia type   4. Tobacco use     PLAN:     CAD: Reported chest pain concerning for typical angina, as describes chest tightness with exertion/stress that resolves with rest.  Coronary CTA on 12/05/2021 showed mixed multivessel CAD with  at least moderate stenosis in the proximal LAD, calcium score 991 (99th percentile).  CT FFR suggested no significant stenosis (0.86 across lesion in proximal LAD, 0.77 across lesion in distal LAD suggestive of slow tapering) and medical therapy recommended.  Echocardiogram 01/02/2022 showed normal biventricular function, no significant valvular disease. -Started rosuvastatin 20 mg daily.  Will check lipid panel -Continue aspirin 81 mg daily  Heart murmur: Echocardiogram without structural heart disease as above  Hypertension: On losartan-HCTZ 50-12.5 mg daily.  Appears controlled.  Check BMET, magnesium  Hyperlipidemia: On simvastatin 20 mg.  LDL 141 on 05/09/2021.  Switched to rosuvastatin 20 mg daily, will check lipid panel  Tobacco use:  Patient counseled risk of tobacco use and cessation strongly recommended  RTC in 6 months   Medication Adjustments/Labs and Tests Ordered: Current medicines are reviewed at length with the patient today.  Concerns regarding medicines are outlined above.  No orders of the defined types were placed in this encounter.  No orders of the defined types were placed in this encounter.   Patient Instructions  Medication Instructions:  Your physician recommends that you continue on your current medications as directed. Please refer to the Current Medication list given to you today.   Labwork: Lipid, Bmet, Mag today at [facility]  Testing/Procedures: none  Follow-Up: Your physician recommends that you schedule a follow-up appointment in 6 months  Any Other Special Instructions Will Be Listed Below (If Applicable).  If you need a refill on your cardiac medications before your next appointment, please call your pharmacy.    Signed, Little Ishikawa, MD  06/07/2022 1:24 PM    [facility]

## 2022-06-07 ENCOUNTER — Other Ambulatory Visit (HOSPITAL_COMMUNITY)
Admission: RE | Admit: 2022-06-07 | Discharge: 2022-06-07 | Disposition: A | Discharge status: Home / Self Care | Payer: Medicare HMO | Attending: Cardiology | Admitting: Cardiology

## 2022-06-07 ENCOUNTER — Encounter: Payer: Self-pay | Admitting: Cardiology

## 2022-06-07 ENCOUNTER — Ambulatory Visit (INDEPENDENT_AMBULATORY_CARE_PROVIDER_SITE_OTHER): Payer: Medicare HMO | Admitting: Cardiology

## 2022-06-07 VITALS — BP 128/88 | HR 82 | Ht 62.0 in | Wt 141.6 lb

## 2022-06-07 DIAGNOSIS — I2510 Atherosclerotic heart disease of native coronary artery without angina pectoris: Secondary | ICD-10-CM

## 2022-06-07 DIAGNOSIS — E785 Hyperlipidemia, unspecified: Secondary | ICD-10-CM | POA: Diagnosis not present

## 2022-06-07 DIAGNOSIS — Z720 Tobacco use: Secondary | ICD-10-CM

## 2022-06-07 DIAGNOSIS — I10 Essential (primary) hypertension: Secondary | ICD-10-CM | POA: Insufficient documentation

## 2022-06-07 LAB — LIPID PANEL
Cholesterol: 179 mg/dL (ref 0–200)
HDL: 73 mg/dL (ref >40)
LDL Cholesterol: 85 mg/dL (ref 0–99)
Total CHOL/HDL Ratio: 2.5 RATIO
Triglycerides: 105 mg/dL (ref <150)
VLDL: 21 mg/dL (ref 0–40)

## 2022-06-07 LAB — BASIC METABOLIC PANEL
Anion gap: 8 (ref 5–15)
BUN: 20 mg/dL (ref 8–23)
CO2: 26 mmol/L (ref 22–32)
Calcium: 9.4 mg/dL (ref 8.9–10.3)
Chloride: 105 mmol/L (ref 98–111)
Creatinine, Ser: 0.85 mg/dL (ref 0.44–1.00)
GFR, Estimated: >60 mL/min (ref >60)
Glucose, Bld: 97 mg/dL (ref 70–99)
Potassium: 3.6 mmol/L (ref 3.5–5.1)
Sodium: 139 mmol/L (ref 135–145)

## 2022-06-07 LAB — MAGNESIUM: Magnesium: 2.2 mg/dL (ref 1.7–2.4)

## 2022-06-11 ENCOUNTER — Other Ambulatory Visit: Payer: Self-pay | Admitting: *Deleted

## 2022-06-11 ENCOUNTER — Encounter: Payer: Self-pay | Admitting: *Deleted

## 2022-06-11 DIAGNOSIS — E785 Hyperlipidemia, unspecified: Secondary | ICD-10-CM

## 2022-06-11 MED ORDER — EZETIMIBE 10 MG PO TABS
10.0000 mg | ORAL_TABLET | Freq: Every day | ORAL | 3 refills | Status: DC
Start: 2022-06-11 — End: 2023-05-17

## 2022-07-03 DIAGNOSIS — I10 Essential (primary) hypertension: Secondary | ICD-10-CM | POA: Diagnosis not present

## 2022-07-03 DIAGNOSIS — I2510 Atherosclerotic heart disease of native coronary artery without angina pectoris: Secondary | ICD-10-CM | POA: Diagnosis not present

## 2022-07-03 DIAGNOSIS — M545 Low back pain, unspecified: Secondary | ICD-10-CM | POA: Diagnosis not present

## 2022-07-17 ENCOUNTER — Encounter: Payer: Self-pay | Admitting: Orthopedic Surgery

## 2022-07-17 ENCOUNTER — Ambulatory Visit: Payer: Medicare HMO | Admitting: Orthopedic Surgery

## 2022-07-17 DIAGNOSIS — M7061 Trochanteric bursitis, right hip: Secondary | ICD-10-CM | POA: Diagnosis not present

## 2022-07-17 NOTE — Patient Instructions (Signed)
Continue to take the medications as prescribed  Can use voltaren gel, biofreeze, patches, heat or ice  Consider an injection  Rarely is surgery needed for this problem

## 2022-07-18 NOTE — Progress Notes (Signed)
Return Patient Visit  Assessment: Lauren Colon is a 66 y.o. female with the following: Right greater trochanteric bursitis.  Plan: Lauren Colon has pain in the lateral aspect of her right hip.  She has exquisite tenderness to palpation over the greater trochanter laterally.  This is consistent with greater trochanteric bursitis.  She is not interested in an injection.  As result, I recommended over-the-counter NSAIDs, topical creams and gels, consideration for lidocaine patches or similar.  She states her understanding.  If she wishes to proceed with an injection, she will contact the clinic.  She is not interested in physical therapy.  Nothing further needed at this time.  Follow-up: Return if symptoms worsen or fail to improve.  Subjective:  Chief Complaint  Patient presents with   Hip Pain    RT hip/painful when sitting a lot/ pain wakes patient up at night    History of Present Illness: Lauren Colon is a 66 y.o. female who presents for evaluation of right hip pain.  I have previously seen her for back pain, with radiculopathy.    She states this feels better at this time.  Pain is in the lateral right hip.  She has no pain in her right groin.  If she sits for long period of time, the pain gets worse.  She is driving for an extended period of time, she has to get up and move around.  Pain gets worse at night, and this makes it difficult for her to sleep.  She has tried medications, and some patches.  Limited improvement in her symptoms.  No prior injections about the right hip  Review of Systems: No fevers or chills No numbness or tingling No chest pain No shortness of breath No bowel or bladder dysfunction No GI distress No headaches     Objective: There were no vitals taken for this visit.  Physical Exam:  General: Alert and oriented. and No acute distress. Gait: Right sided antalgic gait.  Right hip without deformity.  Some discomfort with external and  internal rotation of the hip.  She is very tender directly over the lateral aspect of the greater trochanter.  She has some discomfort with abduction, in a lateral decubitus position.  IMAGING: I personally ordered and reviewed the following images  No new imaging obtained today.   New Medications:  No orders of the defined types were placed in this encounter.     Oliver Barre, MD  07/18/2022 8:04 AM

## 2022-07-25 ENCOUNTER — Encounter (HOSPITAL_COMMUNITY): Payer: Self-pay | Admitting: Emergency Medicine

## 2022-07-25 ENCOUNTER — Emergency Department (HOSPITAL_COMMUNITY): Payer: Medicare HMO

## 2022-07-25 ENCOUNTER — Emergency Department (HOSPITAL_COMMUNITY)
Admission: EM | Admit: 2022-07-25 | Discharge: 2022-07-25 | Disposition: A | Discharge status: Home / Self Care | Payer: Medicare HMO | Attending: Emergency Medicine | Admitting: Emergency Medicine

## 2022-07-25 ENCOUNTER — Other Ambulatory Visit: Payer: Self-pay

## 2022-07-25 DIAGNOSIS — I10 Essential (primary) hypertension: Secondary | ICD-10-CM | POA: Insufficient documentation

## 2022-07-25 DIAGNOSIS — S80812A Abrasion, left lower leg, initial encounter: Secondary | ICD-10-CM | POA: Insufficient documentation

## 2022-07-25 DIAGNOSIS — W1839XA Other fall on same level, initial encounter: Secondary | ICD-10-CM | POA: Diagnosis not present

## 2022-07-25 DIAGNOSIS — M1612 Unilateral primary osteoarthritis, left hip: Secondary | ICD-10-CM | POA: Diagnosis not present

## 2022-07-25 DIAGNOSIS — R42 Dizziness and giddiness: Secondary | ICD-10-CM | POA: Diagnosis not present

## 2022-07-25 DIAGNOSIS — F424 Excoriation (skin-picking) disorder: Secondary | ICD-10-CM | POA: Diagnosis not present

## 2022-07-25 DIAGNOSIS — Z7982 Long term (current) use of aspirin: Secondary | ICD-10-CM | POA: Diagnosis not present

## 2022-07-25 DIAGNOSIS — M25562 Pain in left knee: Secondary | ICD-10-CM | POA: Diagnosis not present

## 2022-07-25 DIAGNOSIS — Z79899 Other long term (current) drug therapy: Secondary | ICD-10-CM | POA: Insufficient documentation

## 2022-07-25 DIAGNOSIS — Z043 Encounter for examination and observation following other accident: Secondary | ICD-10-CM | POA: Diagnosis not present

## 2022-07-25 DIAGNOSIS — D72829 Elevated white blood cell count, unspecified: Secondary | ICD-10-CM | POA: Diagnosis not present

## 2022-07-25 DIAGNOSIS — M79605 Pain in left leg: Secondary | ICD-10-CM

## 2022-07-25 DIAGNOSIS — R69 Illness, unspecified: Secondary | ICD-10-CM | POA: Diagnosis not present

## 2022-07-25 LAB — CBC
HCT: 39.7 % (ref 36.0–46.0)
Hemoglobin: 13 g/dL (ref 12.0–15.0)
MCH: 29.9 pg (ref 26.0–34.0)
MCHC: 32.7 g/dL (ref 30.0–36.0)
MCV: 91.3 fL (ref 80.0–100.0)
Platelets: 260 10*3/uL (ref 150–400)
RBC: 4.35 MIL/uL (ref 3.87–5.11)
RDW: 14.4 % (ref 11.5–15.5)
WBC: 12.3 10*3/uL — ABNORMAL HIGH (ref 4.0–10.5)
nRBC: 0 % (ref 0.0–0.2)

## 2022-07-25 LAB — BASIC METABOLIC PANEL
Anion gap: 7 (ref 5–15)
BUN: 28 mg/dL — ABNORMAL HIGH (ref 8–23)
CO2: 25 mmol/L (ref 22–32)
Calcium: 9.2 mg/dL (ref 8.9–10.3)
Chloride: 108 mmol/L (ref 98–111)
Creatinine, Ser: 0.8 mg/dL (ref 0.44–1.00)
GFR, Estimated: >60 mL/min (ref >60)
Glucose, Bld: 100 mg/dL — ABNORMAL HIGH (ref 70–99)
Potassium: 3.6 mmol/L (ref 3.5–5.1)
Sodium: 140 mmol/L (ref 135–145)

## 2022-07-25 MED ORDER — KETOROLAC TROMETHAMINE 15 MG/ML IJ SOLN
15.0000 mg | Freq: Once | INTRAMUSCULAR | Status: AC
  Administered 2022-07-25: 15 mg via INTRAVENOUS
  Filled 2022-07-25: qty 1

## 2022-07-25 MED ORDER — MORPHINE SULFATE (PF) 4 MG/ML IV SOLN
2.0000 mg | Freq: Once | INTRAVENOUS | Status: AC
  Administered 2022-07-25: 2 mg via INTRAVENOUS
  Filled 2022-07-25: qty 1

## 2022-07-25 MED ORDER — ONDANSETRON HCL 4 MG/2ML IJ SOLN
4.0000 mg | Freq: Once | INTRAMUSCULAR | Status: AC
  Administered 2022-07-25: 4 mg via INTRAVENOUS
  Filled 2022-07-25: qty 2

## 2022-07-25 MED ORDER — IBUPROFEN 600 MG PO TABS: 600.0000 mg | ORAL_TABLET | Freq: Four times a day (QID) | ORAL | 0 refills | Status: DC | PRN

## 2022-07-25 MED ORDER — METHOCARBAMOL 500 MG PO TABS: 500.0000 mg | ORAL_TABLET | Freq: Two times a day (BID) | ORAL | 0 refills | Status: AC

## 2022-07-25 NOTE — ED Notes (Signed)
Patient transported to X-ray 

## 2022-07-25 NOTE — ED Notes (Signed)
LLE cleaned with NS, wrapped with telfa non adherent dressing and kerlex

## 2022-07-25 NOTE — Discharge Instructions (Addendum)
As we discussed I recommend that you follow-up with your cardiologist especially if you are having any ongoing dizziness, lightheadedness.  In the meantime you can use your at home tramadol, Voltaren to help with pain.  I am prescribing you some prescription strength ibuprofen as well as muscle relaxant to ease your pain as well.  Recommend rest, ice, compression, and elevation of the affected extremity.  You can change the bandage on your leg once daily, I would continue using your Neosporin impregnated bandages to help prevent any possible infection.  Please return if you have any concern for infection or worsening pain despite treatment.

## 2022-07-25 NOTE — ED Triage Notes (Signed)
Pt to the ED after a fall last night. Pt landed on her left buttocks and has abrasions to her LLL.  The pt states she did not hit her head, but fell due to feeling dizzy.

## 2022-07-25 NOTE — ED Provider Notes (Signed)
[facility] EMERGENCY DEPARTMENT Provider Note   CSN: [id number] Arrival date & time: 07/25/22  6811     History  Chief Complaint  Patient presents with   Marletta Lor    Lauren Colon is a 66 y.o. female with past medical history significant for hypertension, fibromyalgia, arthritis who presents with concern for fall onto left buttocks, left lower extremity.  Patient reports that she was coming back from a walk which she takes every day, started to step onto the porch, reports that she got 1 foot onto the porch and then before second one she started to feel lightheaded, missed her step and fell hard onto the left side.  She denies any injury to the left abdomen, left upper extremities, she denies hitting her head, denies loss of consciousness.  Patient reports some scrapes, abrasions, and pain to the left buttocks, left tibia.  She reports that she cleaned off the wounds with hydrogen peroxide, and bleeding is controlled at time of evaluation.  Patient denies any chest pain, feeling of heart racing, shortness of breath at time of feeling lightheaded.  Patient is worried that he could have had something to do with her blood pressure however reporting that her blood pressure was quite high at the time of the fall.  At time of my evaluation patient denies any numbness, tingling, ongoing dizziness, lightheadedness, vision changes, double vision, facial droop, she denies current chest pain, shortness of breath.  She has not taken anything for pain this morning.  She rates her pain 10/10.   Fall       Home Medications Prior to Admission medications   Medication Sig Start Date End Date Taking? Authorizing Provider  ibuprofen (ADVIL) 600 MG tablet Take 1 tablet (600 mg total) by mouth every 6 (six) hours as needed. 07/25/22  Yes Jayon Matton H, PA-C  methocarbamol (ROBAXIN) 500 MG tablet Take 1 tablet (500 mg total) by mouth 2 (two) times daily. 07/25/22  Yes Ervine Witucki H, PA-C  aspirin  81 MG tablet Take 81 mg by mouth daily.    [provider]  diclofenac (VOLTAREN) 50 MG EC tablet TAKE 1 TABLET(50 MG) BY MOUTH TWICE DAILY Patient taking differently: Take 50 mg by mouth 2 (two) times daily. 02/22/22   Oliver Barre, MD  ezetimibe (ZETIA) 10 MG tablet Take 1 tablet (10 mg total) by mouth daily. 06/11/22 06/06/23  Little Ishikawa, MD  losartan-hydrochlorothiazide (HYZAAR) 50-12.5 MG tablet Take 1 tablet by mouth daily. 07/28/21   [provider]  rosuvastatin (CRESTOR) 20 MG tablet Take 1 tablet (20 mg total) by mouth daily. 12/14/21   Little Ishikawa, MD  traMADol (ULTRAM) 50 MG tablet Take 1 tablet (50 mg total) by mouth 3 (three) times daily as needed. Patient not taking: Reported on 06/07/2022 07/27/18   Cathren Laine, MD      Allergies    Aspirin    Review of Systems   Review of Systems  Musculoskeletal:  Positive for arthralgias and myalgias.  Neurological:  Positive for light-headedness.  All other systems reviewed and are negative.   Physical Exam Updated Vital Signs BP (!) 146/80   Pulse 64   Temp 97.8 F (36.6 C)   Resp 19   Ht 5\' 2"  (1.575 m)   Wt 64.2 kg   SpO2 100%   BMI 25.89 kg/m  Physical Exam Vitals and nursing note reviewed.  Constitutional:      General: She is not in acute distress.  Appearance: Normal appearance.  HENT:     Head: Normocephalic and atraumatic.  Eyes:     General:        Right eye: No discharge.        Left eye: No discharge.  Cardiovascular:     Rate and Rhythm: Normal rate and regular rhythm.     Heart sounds: No murmur heard.    No friction rub. No gallop.  Pulmonary:     Effort: Pulmonary effort is normal.     Breath sounds: Normal breath sounds.  Abdominal:     General: Bowel sounds are normal.     Palpations: Abdomen is soft.  Musculoskeletal:     Comments: Intact strength 5 out of 5 bilateral lower extremities, some decreased range of motion actively secondary to pain,  tenderness palpation over the left buttocks, left knee, and most focally over the left tibia.  No step-off or deformity noted.  Patient is ambulatory.  Skin:    General: Skin is warm and dry.     Capillary Refill: Capillary refill takes less than 2 seconds.     Comments: Creations and abrasions overlying the anterior surface of the left tibia, no active bleeding, no retained foreign bodies, gravel.  No deep lacerations, some soft tissue bruising overlying the tibia.  Neurological:     Mental Status: She is alert and oriented to person, place, and time.     Comments: Cranial nerves II through XII grossly intact.  Intact finger-nose, intact heel-to-shin.  Romberg negative, gait normal.  Alert and oriented x3.  Moves all 4 limbs spontaneously, normal coordination.  No pronator drift.  Intact strength 5 out of 5 bilateral upper and lower extremities.    Psychiatric:        Mood and Affect: Mood normal.        Behavior: Behavior normal.     ED Results / Procedures / Treatments   Labs (all labs ordered are listed, but only abnormal results are displayed) Labs Reviewed  CBC - Abnormal; Notable for the following components:      Result Value   WBC 12.3 (*)    All other components within normal limits  BASIC METABOLIC PANEL - Abnormal; Notable for the following components:   Glucose, Bld 100 (*)    BUN 28 (*)    All other components within normal limits    EKG EKG Interpretation  Date/Time:  Wednesday July 25 2022 09:22:52 EDT Ventricular Rate:  71 PR Interval:  151 QRS Duration: 71 QT Interval:  416 QTC Calculation: 453 R Axis:   61 Text Interpretation: Sinus rhythm Confirmed by Octaviano Glow [phone]) on 07/25/2022 9:26:53 AM  Radiology DG Hip Unilat W or Wo Pelvis 2-3 Views Left  Result Date: 07/25/2022 CLINICAL DATA:  [place] last night on her left buttock. EXAM: DG HIP (WITH OR WITHOUT PELVIS) 2-3V LEFT COMPARISON:  None Available. FINDINGS: Normal appearing left hip without  fracture or dislocation. Moderate to marked right hip degenerative changes with subarticular cystic changes. Mild lower lumbar spine degenerative changes. IMPRESSION: 1. Normal appearing left hip. 2. Moderate to marked right hip degenerative changes. Electronically Signed   By: Claudie Revering M.D.   On: 07/25/2022 10:11   DG Tibia/Fibula Left  Result Date: 07/25/2022 CLINICAL DATA:  Left knee pain following a fall. EXAM: LEFT TIBIA AND FIBULA - 2 VIEW COMPARISON:  None Available. FINDINGS: There is no evidence of fracture or other focal bone lesions. Soft tissues are unremarkable. IMPRESSION: Normal examination. Electronically  Signed   By: Beckie Salts M.D.   On: 07/25/2022 10:10    Procedures Procedures    Medications Ordered in ED Medications  morphine (PF) 4 MG/ML injection 2 mg (2 mg Intravenous Given 07/25/22 0947)  ketorolac (TORADOL) 15 MG/ML injection 15 mg (15 mg Intravenous Given 07/25/22 0946)  ondansetron (ZOFRAN) injection 4 mg (4 mg Intravenous Given 07/25/22 0946)    ED Course/ Medical Decision Making/ A&P                           Medical Decision Making Amount and/or Complexity of Data Reviewed Labs: ordered. Radiology: ordered.  Risk Prescription drug management.   This patient is a 66 y.o. female who presents to the ED for concern of fall, left leg pain, secondary to feeling of lightheadedness, this involves an extensive number of treatment options, and is a complaint that carries with it a high risk of complications and morbidity. The emergent differential diagnosis prior to evaluation includes, but is not limited to, acute fracture, dislocation, other musculoskeletal injury of the left lower extremity, excoriation or laceration requiring repair secondary to fall, because of feeling of lightheadedness symptoms including cardiogenic syncope, neurogenic syncope, dehydration, hypotension, versus other.  This is not an exhaustive differential.   Past Medical History /  Co-morbidities / Social History: Fibromyalgia, hypertension, arthritis  Additional history: Chart reviewed. Pertinent results include: Reviewed previous lab work, imaging from outpatient cardiology, orthopedics, and family medicine visits as well as remote emergency department visits  Physical Exam: Physical exam performed. The pertinent findings include: Patient with some tenderness palpation, bruising noted along left hip, left upper thigh, and left shin.  She is ambulatory and neurovascularly intact throughout.  She has a excoriations over the left lower extremity.  She is normal heart rate and rhythm on my exam.  She is minimally hypertensive with blood pressure 161/101 on arrival.  Improved to 146/88 with pain control and rest.  Patient with no neurologic deficits on my exam today.  Lab Tests: I ordered, and personally interpreted labs.  The pertinent results include: Minimal leukocytosis, white blood cells 12.3, likely secondary to stress response.  Otherwise unremarkable CBC.  BMP overall unremarkable, minimally elevated BUN of 28.   Imaging Studies: I ordered imaging studies including plain film radiograph of the left hip, and left tibia/fibula. I independently visualized and interpreted imaging which showed no acute fracture, dislocation. I agree with the radiologist interpretation.   Cardiac Monitoring:  The patient was maintained on a cardiac monitor.  My attending physician Dr. Renaye Rakers viewed and interpreted the cardiac monitored which showed an underlying rhythm of: NSR. I agree with this interpretation.   Medications: I ordered medication including Toradol, morphine for pain. Reevaluation of the patient after these medicines showed that the patient improved. I have reviewed the patients home medicines and have made adjustments as needed.   Disposition: After consideration of the diagnostic results and the patients response to treatment, I feel that patient symptoms are consistent  with a mechanical fall onto the left lower extremity.  Will provide wound care for the affected extremity, otherwise no identified cause for patient's report of hypotension, syncope/near syncope.  No evidence of cardiogenic arrhythmia, neurologic deficit, low suspicion for posterior circulation stroke or other stroke symptoms.Marland Kitchen   emergency department workup does not suggest an emergent condition requiring admission or immediate intervention beyond what has been performed at this time. The plan is: RICE, pain control, and wound care  for the left lower extremity, follow-up as needed with PCP, orthopedics, and recommend follow-up with cardiology for evaluation of lightheadedness especially if ongoing. The patient is safe for discharge and has been instructed to return immediately for worsening symptoms, change in symptoms or any other concerns.  I discussed this case with my attending physician Dr. Langston Masker who cosigned this note including patient's presenting symptoms, physical exam, and planned diagnostics and interventions. Attending physician stated agreement with plan or made changes to plan which were implemented.    Final Clinical Impression(s) / ED Diagnoses Final diagnoses:  Lightheadedness  Left leg pain  Excoriation of left lower leg, initial encounter    Rx / DC Orders ED Discharge Orders          Ordered    methocarbamol (ROBAXIN) 500 MG tablet  2 times daily        07/25/22 1034    ibuprofen (ADVIL) 600 MG tablet  Every 6 hours PRN        07/25/22 1034              Korver Graybeal, [place] H, PA-C 07/25/22 1039    Wyvonnia Dusky, MD 07/25/22 1045

## 2022-08-03 DIAGNOSIS — S81812D Laceration without foreign body, left lower leg, subsequent encounter: Secondary | ICD-10-CM | POA: Diagnosis not present

## 2022-08-03 DIAGNOSIS — I10 Essential (primary) hypertension: Secondary | ICD-10-CM | POA: Diagnosis not present

## 2022-08-03 DIAGNOSIS — W19XXXD Unspecified fall, subsequent encounter: Secondary | ICD-10-CM | POA: Diagnosis not present

## 2022-08-23 ENCOUNTER — Other Ambulatory Visit: Payer: Self-pay | Admitting: Orthopedic Surgery

## 2022-08-24 DIAGNOSIS — I10 Essential (primary) hypertension: Secondary | ICD-10-CM | POA: Diagnosis not present

## 2022-08-24 DIAGNOSIS — S81812D Laceration without foreign body, left lower leg, subsequent encounter: Secondary | ICD-10-CM | POA: Diagnosis not present

## 2022-08-27 ENCOUNTER — Other Ambulatory Visit (HOSPITAL_COMMUNITY): Payer: Self-pay | Admitting: Internal Medicine

## 2022-08-27 DIAGNOSIS — Z1231 Encounter for screening mammogram for malignant neoplasm of breast: Secondary | ICD-10-CM

## 2022-08-29 DIAGNOSIS — I2510 Atherosclerotic heart disease of native coronary artery without angina pectoris: Secondary | ICD-10-CM | POA: Diagnosis not present

## 2022-08-29 DIAGNOSIS — S81812D Laceration without foreign body, left lower leg, subsequent encounter: Secondary | ICD-10-CM | POA: Diagnosis not present

## 2022-08-29 DIAGNOSIS — E785 Hyperlipidemia, unspecified: Secondary | ICD-10-CM | POA: Diagnosis not present

## 2022-08-29 DIAGNOSIS — R69 Illness, unspecified: Secondary | ICD-10-CM | POA: Diagnosis not present

## 2022-08-29 DIAGNOSIS — I10 Essential (primary) hypertension: Secondary | ICD-10-CM | POA: Diagnosis not present

## 2022-08-29 DIAGNOSIS — Z9181 History of falling: Secondary | ICD-10-CM | POA: Diagnosis not present

## 2022-08-29 DIAGNOSIS — Z7982 Long term (current) use of aspirin: Secondary | ICD-10-CM | POA: Diagnosis not present

## 2022-08-29 DIAGNOSIS — S81812A Laceration without foreign body, left lower leg, initial encounter: Secondary | ICD-10-CM | POA: Diagnosis not present

## 2022-08-30 ENCOUNTER — Other Ambulatory Visit (HOSPITAL_COMMUNITY)
Admission: RE | Admit: 2022-08-30 | Discharge: 2022-08-30 | Disposition: A | Discharge status: Home / Self Care | Payer: Medicare HMO | Source: Ambulatory Visit | Attending: Cardiology | Admitting: Cardiology

## 2022-08-30 DIAGNOSIS — E785 Hyperlipidemia, unspecified: Secondary | ICD-10-CM | POA: Diagnosis not present

## 2022-08-30 LAB — LIPID PANEL
Cholesterol: 105 mg/dL (ref 0–200)
HDL: 60 mg/dL (ref >40)
LDL Cholesterol: 35 mg/dL (ref 0–99)
Total CHOL/HDL Ratio: 1.8 RATIO
Triglycerides: 48 mg/dL (ref <150)
VLDL: 10 mg/dL (ref 0–40)

## 2022-08-31 ENCOUNTER — Encounter: Payer: Self-pay | Admitting: *Deleted

## 2022-09-03 DIAGNOSIS — R69 Illness, unspecified: Secondary | ICD-10-CM | POA: Diagnosis not present

## 2022-09-03 DIAGNOSIS — S81812D Laceration without foreign body, left lower leg, subsequent encounter: Secondary | ICD-10-CM | POA: Diagnosis not present

## 2022-09-03 DIAGNOSIS — Z7982 Long term (current) use of aspirin: Secondary | ICD-10-CM | POA: Diagnosis not present

## 2022-09-03 DIAGNOSIS — I10 Essential (primary) hypertension: Secondary | ICD-10-CM | POA: Diagnosis not present

## 2022-09-03 DIAGNOSIS — E785 Hyperlipidemia, unspecified: Secondary | ICD-10-CM | POA: Diagnosis not present

## 2022-09-03 DIAGNOSIS — I2510 Atherosclerotic heart disease of native coronary artery without angina pectoris: Secondary | ICD-10-CM | POA: Diagnosis not present

## 2022-09-03 DIAGNOSIS — Z9181 History of falling: Secondary | ICD-10-CM | POA: Diagnosis not present

## 2022-09-05 DIAGNOSIS — Z9181 History of falling: Secondary | ICD-10-CM | POA: Diagnosis not present

## 2022-09-05 DIAGNOSIS — I2510 Atherosclerotic heart disease of native coronary artery without angina pectoris: Secondary | ICD-10-CM | POA: Diagnosis not present

## 2022-09-05 DIAGNOSIS — E785 Hyperlipidemia, unspecified: Secondary | ICD-10-CM | POA: Diagnosis not present

## 2022-09-05 DIAGNOSIS — Z7982 Long term (current) use of aspirin: Secondary | ICD-10-CM | POA: Diagnosis not present

## 2022-09-05 DIAGNOSIS — R69 Illness, unspecified: Secondary | ICD-10-CM | POA: Diagnosis not present

## 2022-09-05 DIAGNOSIS — S81812D Laceration without foreign body, left lower leg, subsequent encounter: Secondary | ICD-10-CM | POA: Diagnosis not present

## 2022-09-05 DIAGNOSIS — I10 Essential (primary) hypertension: Secondary | ICD-10-CM | POA: Diagnosis not present

## 2022-09-07 DIAGNOSIS — Z9181 History of falling: Secondary | ICD-10-CM | POA: Diagnosis not present

## 2022-09-07 DIAGNOSIS — E785 Hyperlipidemia, unspecified: Secondary | ICD-10-CM | POA: Diagnosis not present

## 2022-09-07 DIAGNOSIS — I2510 Atherosclerotic heart disease of native coronary artery without angina pectoris: Secondary | ICD-10-CM | POA: Diagnosis not present

## 2022-09-07 DIAGNOSIS — R69 Illness, unspecified: Secondary | ICD-10-CM | POA: Diagnosis not present

## 2022-09-07 DIAGNOSIS — Z7982 Long term (current) use of aspirin: Secondary | ICD-10-CM | POA: Diagnosis not present

## 2022-09-07 DIAGNOSIS — I10 Essential (primary) hypertension: Secondary | ICD-10-CM | POA: Diagnosis not present

## 2022-09-07 DIAGNOSIS — S81812D Laceration without foreign body, left lower leg, subsequent encounter: Secondary | ICD-10-CM | POA: Diagnosis not present

## 2022-09-11 DIAGNOSIS — R69 Illness, unspecified: Secondary | ICD-10-CM | POA: Diagnosis not present

## 2022-09-11 DIAGNOSIS — I10 Essential (primary) hypertension: Secondary | ICD-10-CM | POA: Diagnosis not present

## 2022-09-11 DIAGNOSIS — S81812D Laceration without foreign body, left lower leg, subsequent encounter: Secondary | ICD-10-CM | POA: Diagnosis not present

## 2022-09-11 DIAGNOSIS — Z7982 Long term (current) use of aspirin: Secondary | ICD-10-CM | POA: Diagnosis not present

## 2022-09-11 DIAGNOSIS — I2510 Atherosclerotic heart disease of native coronary artery without angina pectoris: Secondary | ICD-10-CM | POA: Diagnosis not present

## 2022-09-11 DIAGNOSIS — E785 Hyperlipidemia, unspecified: Secondary | ICD-10-CM | POA: Diagnosis not present

## 2022-09-11 DIAGNOSIS — Z9181 History of falling: Secondary | ICD-10-CM | POA: Diagnosis not present

## 2022-09-14 DIAGNOSIS — I2510 Atherosclerotic heart disease of native coronary artery without angina pectoris: Secondary | ICD-10-CM | POA: Diagnosis not present

## 2022-09-14 DIAGNOSIS — I10 Essential (primary) hypertension: Secondary | ICD-10-CM | POA: Diagnosis not present

## 2022-09-14 DIAGNOSIS — S81812D Laceration without foreign body, left lower leg, subsequent encounter: Secondary | ICD-10-CM | POA: Diagnosis not present

## 2022-09-14 DIAGNOSIS — E785 Hyperlipidemia, unspecified: Secondary | ICD-10-CM | POA: Diagnosis not present

## 2022-09-14 DIAGNOSIS — Z7982 Long term (current) use of aspirin: Secondary | ICD-10-CM | POA: Diagnosis not present

## 2022-09-14 DIAGNOSIS — Z9181 History of falling: Secondary | ICD-10-CM | POA: Diagnosis not present

## 2022-09-14 DIAGNOSIS — R69 Illness, unspecified: Secondary | ICD-10-CM | POA: Diagnosis not present

## 2022-09-19 DIAGNOSIS — Z9181 History of falling: Secondary | ICD-10-CM | POA: Diagnosis not present

## 2022-09-19 DIAGNOSIS — Z7982 Long term (current) use of aspirin: Secondary | ICD-10-CM | POA: Diagnosis not present

## 2022-09-19 DIAGNOSIS — R69 Illness, unspecified: Secondary | ICD-10-CM | POA: Diagnosis not present

## 2022-09-19 DIAGNOSIS — E785 Hyperlipidemia, unspecified: Secondary | ICD-10-CM | POA: Diagnosis not present

## 2022-09-19 DIAGNOSIS — S81812D Laceration without foreign body, left lower leg, subsequent encounter: Secondary | ICD-10-CM | POA: Diagnosis not present

## 2022-09-19 DIAGNOSIS — I2510 Atherosclerotic heart disease of native coronary artery without angina pectoris: Secondary | ICD-10-CM | POA: Diagnosis not present

## 2022-09-19 DIAGNOSIS — I10 Essential (primary) hypertension: Secondary | ICD-10-CM | POA: Diagnosis not present

## 2022-09-21 DIAGNOSIS — R69 Illness, unspecified: Secondary | ICD-10-CM | POA: Diagnosis not present

## 2022-09-21 DIAGNOSIS — I2510 Atherosclerotic heart disease of native coronary artery without angina pectoris: Secondary | ICD-10-CM | POA: Diagnosis not present

## 2022-09-21 DIAGNOSIS — S81812D Laceration without foreign body, left lower leg, subsequent encounter: Secondary | ICD-10-CM | POA: Diagnosis not present

## 2022-09-21 DIAGNOSIS — Z9181 History of falling: Secondary | ICD-10-CM | POA: Diagnosis not present

## 2022-09-21 DIAGNOSIS — Z7982 Long term (current) use of aspirin: Secondary | ICD-10-CM | POA: Diagnosis not present

## 2022-09-21 DIAGNOSIS — I10 Essential (primary) hypertension: Secondary | ICD-10-CM | POA: Diagnosis not present

## 2022-09-21 DIAGNOSIS — E785 Hyperlipidemia, unspecified: Secondary | ICD-10-CM | POA: Diagnosis not present

## 2022-09-25 DIAGNOSIS — E785 Hyperlipidemia, unspecified: Secondary | ICD-10-CM | POA: Diagnosis not present

## 2022-09-25 DIAGNOSIS — I2510 Atherosclerotic heart disease of native coronary artery without angina pectoris: Secondary | ICD-10-CM | POA: Diagnosis not present

## 2022-09-25 DIAGNOSIS — R69 Illness, unspecified: Secondary | ICD-10-CM | POA: Diagnosis not present

## 2022-09-25 DIAGNOSIS — Z9181 History of falling: Secondary | ICD-10-CM | POA: Diagnosis not present

## 2022-09-25 DIAGNOSIS — I10 Essential (primary) hypertension: Secondary | ICD-10-CM | POA: Diagnosis not present

## 2022-09-25 DIAGNOSIS — S81812D Laceration without foreign body, left lower leg, subsequent encounter: Secondary | ICD-10-CM | POA: Diagnosis not present

## 2022-09-25 DIAGNOSIS — E783 Hyperchylomicronemia: Secondary | ICD-10-CM | POA: Diagnosis not present

## 2022-09-25 DIAGNOSIS — M1990 Unspecified osteoarthritis, unspecified site: Secondary | ICD-10-CM | POA: Diagnosis not present

## 2022-09-25 DIAGNOSIS — Z7982 Long term (current) use of aspirin: Secondary | ICD-10-CM | POA: Diagnosis not present

## 2022-09-28 DIAGNOSIS — I10 Essential (primary) hypertension: Secondary | ICD-10-CM | POA: Diagnosis not present

## 2022-09-28 DIAGNOSIS — I2510 Atherosclerotic heart disease of native coronary artery without angina pectoris: Secondary | ICD-10-CM | POA: Diagnosis not present

## 2022-09-28 DIAGNOSIS — E785 Hyperlipidemia, unspecified: Secondary | ICD-10-CM | POA: Diagnosis not present

## 2022-09-28 DIAGNOSIS — S81812D Laceration without foreign body, left lower leg, subsequent encounter: Secondary | ICD-10-CM | POA: Diagnosis not present

## 2022-09-28 DIAGNOSIS — Z9181 History of falling: Secondary | ICD-10-CM | POA: Diagnosis not present

## 2022-09-28 DIAGNOSIS — Z7982 Long term (current) use of aspirin: Secondary | ICD-10-CM | POA: Diagnosis not present

## 2022-09-28 DIAGNOSIS — R69 Illness, unspecified: Secondary | ICD-10-CM | POA: Diagnosis not present

## 2022-10-02 DIAGNOSIS — E785 Hyperlipidemia, unspecified: Secondary | ICD-10-CM | POA: Diagnosis not present

## 2022-10-02 DIAGNOSIS — S81812D Laceration without foreign body, left lower leg, subsequent encounter: Secondary | ICD-10-CM | POA: Diagnosis not present

## 2022-10-02 DIAGNOSIS — I10 Essential (primary) hypertension: Secondary | ICD-10-CM | POA: Diagnosis not present

## 2022-10-02 DIAGNOSIS — Z7982 Long term (current) use of aspirin: Secondary | ICD-10-CM | POA: Diagnosis not present

## 2022-10-02 DIAGNOSIS — I2510 Atherosclerotic heart disease of native coronary artery without angina pectoris: Secondary | ICD-10-CM | POA: Diagnosis not present

## 2022-10-02 DIAGNOSIS — Z9181 History of falling: Secondary | ICD-10-CM | POA: Diagnosis not present

## 2022-10-02 DIAGNOSIS — R69 Illness, unspecified: Secondary | ICD-10-CM | POA: Diagnosis not present

## 2022-10-05 DIAGNOSIS — I2510 Atherosclerotic heart disease of native coronary artery without angina pectoris: Secondary | ICD-10-CM | POA: Diagnosis not present

## 2022-10-05 DIAGNOSIS — E785 Hyperlipidemia, unspecified: Secondary | ICD-10-CM | POA: Diagnosis not present

## 2022-10-05 DIAGNOSIS — Z7982 Long term (current) use of aspirin: Secondary | ICD-10-CM | POA: Diagnosis not present

## 2022-10-05 DIAGNOSIS — S81812D Laceration without foreign body, left lower leg, subsequent encounter: Secondary | ICD-10-CM | POA: Diagnosis not present

## 2022-10-05 DIAGNOSIS — Z9181 History of falling: Secondary | ICD-10-CM | POA: Diagnosis not present

## 2022-10-05 DIAGNOSIS — I10 Essential (primary) hypertension: Secondary | ICD-10-CM | POA: Diagnosis not present

## 2022-10-05 DIAGNOSIS — R69 Illness, unspecified: Secondary | ICD-10-CM | POA: Diagnosis not present

## 2022-10-08 ENCOUNTER — Ambulatory Visit (HOSPITAL_COMMUNITY): Payer: Medicare HMO

## 2022-10-08 DIAGNOSIS — I10 Essential (primary) hypertension: Secondary | ICD-10-CM | POA: Diagnosis not present

## 2022-10-08 DIAGNOSIS — R69 Illness, unspecified: Secondary | ICD-10-CM | POA: Diagnosis not present

## 2022-10-08 DIAGNOSIS — Z9181 History of falling: Secondary | ICD-10-CM | POA: Diagnosis not present

## 2022-10-08 DIAGNOSIS — Z7982 Long term (current) use of aspirin: Secondary | ICD-10-CM | POA: Diagnosis not present

## 2022-10-08 DIAGNOSIS — S81812D Laceration without foreign body, left lower leg, subsequent encounter: Secondary | ICD-10-CM | POA: Diagnosis not present

## 2022-10-08 DIAGNOSIS — E785 Hyperlipidemia, unspecified: Secondary | ICD-10-CM | POA: Diagnosis not present

## 2022-10-08 DIAGNOSIS — I2510 Atherosclerotic heart disease of native coronary artery without angina pectoris: Secondary | ICD-10-CM | POA: Diagnosis not present

## 2022-10-12 DIAGNOSIS — Z7982 Long term (current) use of aspirin: Secondary | ICD-10-CM | POA: Diagnosis not present

## 2022-10-12 DIAGNOSIS — Z9181 History of falling: Secondary | ICD-10-CM | POA: Diagnosis not present

## 2022-10-12 DIAGNOSIS — S81812D Laceration without foreign body, left lower leg, subsequent encounter: Secondary | ICD-10-CM | POA: Diagnosis not present

## 2022-10-12 DIAGNOSIS — I2510 Atherosclerotic heart disease of native coronary artery without angina pectoris: Secondary | ICD-10-CM | POA: Diagnosis not present

## 2022-10-12 DIAGNOSIS — R69 Illness, unspecified: Secondary | ICD-10-CM | POA: Diagnosis not present

## 2022-10-12 DIAGNOSIS — I10 Essential (primary) hypertension: Secondary | ICD-10-CM | POA: Diagnosis not present

## 2022-10-12 DIAGNOSIS — E785 Hyperlipidemia, unspecified: Secondary | ICD-10-CM | POA: Diagnosis not present

## 2022-10-16 DIAGNOSIS — S81812D Laceration without foreign body, left lower leg, subsequent encounter: Secondary | ICD-10-CM | POA: Diagnosis not present

## 2022-10-16 DIAGNOSIS — R69 Illness, unspecified: Secondary | ICD-10-CM | POA: Diagnosis not present

## 2022-10-16 DIAGNOSIS — Z9181 History of falling: Secondary | ICD-10-CM | POA: Diagnosis not present

## 2022-10-16 DIAGNOSIS — I10 Essential (primary) hypertension: Secondary | ICD-10-CM | POA: Diagnosis not present

## 2022-10-16 DIAGNOSIS — E785 Hyperlipidemia, unspecified: Secondary | ICD-10-CM | POA: Diagnosis not present

## 2022-10-16 DIAGNOSIS — Z7982 Long term (current) use of aspirin: Secondary | ICD-10-CM | POA: Diagnosis not present

## 2022-10-16 DIAGNOSIS — I2510 Atherosclerotic heart disease of native coronary artery without angina pectoris: Secondary | ICD-10-CM | POA: Diagnosis not present

## 2022-10-18 ENCOUNTER — Ambulatory Visit (HOSPITAL_COMMUNITY): Payer: Medicare HMO

## 2022-10-18 DIAGNOSIS — I10 Essential (primary) hypertension: Secondary | ICD-10-CM | POA: Diagnosis not present

## 2022-10-18 DIAGNOSIS — Z7982 Long term (current) use of aspirin: Secondary | ICD-10-CM | POA: Diagnosis not present

## 2022-10-18 DIAGNOSIS — Z9181 History of falling: Secondary | ICD-10-CM | POA: Diagnosis not present

## 2022-10-18 DIAGNOSIS — I2510 Atherosclerotic heart disease of native coronary artery without angina pectoris: Secondary | ICD-10-CM | POA: Diagnosis not present

## 2022-10-18 DIAGNOSIS — S81812D Laceration without foreign body, left lower leg, subsequent encounter: Secondary | ICD-10-CM | POA: Diagnosis not present

## 2022-10-18 DIAGNOSIS — R69 Illness, unspecified: Secondary | ICD-10-CM | POA: Diagnosis not present

## 2022-10-18 DIAGNOSIS — E785 Hyperlipidemia, unspecified: Secondary | ICD-10-CM | POA: Diagnosis not present

## 2022-10-19 ENCOUNTER — Ambulatory Visit (HOSPITAL_COMMUNITY)
Admission: RE | Admit: 2022-10-19 | Discharge: 2022-10-19 | Disposition: A | Discharge status: Home / Self Care | Payer: Medicare HMO | Source: Ambulatory Visit | Attending: Internal Medicine | Admitting: Internal Medicine

## 2022-10-19 DIAGNOSIS — Z1231 Encounter for screening mammogram for malignant neoplasm of breast: Secondary | ICD-10-CM | POA: Diagnosis not present

## 2022-10-23 DIAGNOSIS — Z9181 History of falling: Secondary | ICD-10-CM | POA: Diagnosis not present

## 2022-10-23 DIAGNOSIS — Z7982 Long term (current) use of aspirin: Secondary | ICD-10-CM | POA: Diagnosis not present

## 2022-10-23 DIAGNOSIS — I2510 Atherosclerotic heart disease of native coronary artery without angina pectoris: Secondary | ICD-10-CM | POA: Diagnosis not present

## 2022-10-23 DIAGNOSIS — I10 Essential (primary) hypertension: Secondary | ICD-10-CM | POA: Diagnosis not present

## 2022-10-23 DIAGNOSIS — E785 Hyperlipidemia, unspecified: Secondary | ICD-10-CM | POA: Diagnosis not present

## 2022-10-23 DIAGNOSIS — R69 Illness, unspecified: Secondary | ICD-10-CM | POA: Diagnosis not present

## 2022-10-23 DIAGNOSIS — S81812D Laceration without foreign body, left lower leg, subsequent encounter: Secondary | ICD-10-CM | POA: Diagnosis not present

## 2022-10-26 DIAGNOSIS — I10 Essential (primary) hypertension: Secondary | ICD-10-CM | POA: Diagnosis not present

## 2022-10-26 DIAGNOSIS — E782 Mixed hyperlipidemia: Secondary | ICD-10-CM | POA: Diagnosis not present

## 2022-11-15 DIAGNOSIS — Z7982 Long term (current) use of aspirin: Secondary | ICD-10-CM | POA: Diagnosis not present

## 2022-11-15 DIAGNOSIS — I10 Essential (primary) hypertension: Secondary | ICD-10-CM | POA: Diagnosis not present

## 2022-11-15 DIAGNOSIS — S81812D Laceration without foreign body, left lower leg, subsequent encounter: Secondary | ICD-10-CM | POA: Diagnosis not present

## 2022-11-15 DIAGNOSIS — E785 Hyperlipidemia, unspecified: Secondary | ICD-10-CM | POA: Diagnosis not present

## 2022-11-15 DIAGNOSIS — Z9181 History of falling: Secondary | ICD-10-CM | POA: Diagnosis not present

## 2022-11-15 DIAGNOSIS — R69 Illness, unspecified: Secondary | ICD-10-CM | POA: Diagnosis not present

## 2022-11-15 DIAGNOSIS — I2510 Atherosclerotic heart disease of native coronary artery without angina pectoris: Secondary | ICD-10-CM | POA: Diagnosis not present

## 2022-11-20 DIAGNOSIS — Z9181 History of falling: Secondary | ICD-10-CM | POA: Diagnosis not present

## 2022-11-20 DIAGNOSIS — S81812D Laceration without foreign body, left lower leg, subsequent encounter: Secondary | ICD-10-CM | POA: Diagnosis not present

## 2022-11-20 DIAGNOSIS — R69 Illness, unspecified: Secondary | ICD-10-CM | POA: Diagnosis not present

## 2022-11-20 DIAGNOSIS — E785 Hyperlipidemia, unspecified: Secondary | ICD-10-CM | POA: Diagnosis not present

## 2022-11-20 DIAGNOSIS — Z7982 Long term (current) use of aspirin: Secondary | ICD-10-CM | POA: Diagnosis not present

## 2022-11-20 DIAGNOSIS — I2510 Atherosclerotic heart disease of native coronary artery without angina pectoris: Secondary | ICD-10-CM | POA: Diagnosis not present

## 2022-11-20 DIAGNOSIS — I10 Essential (primary) hypertension: Secondary | ICD-10-CM | POA: Diagnosis not present

## 2022-11-25 DIAGNOSIS — E782 Mixed hyperlipidemia: Secondary | ICD-10-CM | POA: Diagnosis not present

## 2022-11-25 DIAGNOSIS — I10 Essential (primary) hypertension: Secondary | ICD-10-CM | POA: Diagnosis not present

## 2022-11-29 DIAGNOSIS — Z7982 Long term (current) use of aspirin: Secondary | ICD-10-CM | POA: Diagnosis not present

## 2022-11-29 DIAGNOSIS — R69 Illness, unspecified: Secondary | ICD-10-CM | POA: Diagnosis not present

## 2022-11-29 DIAGNOSIS — S81812D Laceration without foreign body, left lower leg, subsequent encounter: Secondary | ICD-10-CM | POA: Diagnosis not present

## 2022-11-29 DIAGNOSIS — E785 Hyperlipidemia, unspecified: Secondary | ICD-10-CM | POA: Diagnosis not present

## 2022-11-29 DIAGNOSIS — I10 Essential (primary) hypertension: Secondary | ICD-10-CM | POA: Diagnosis not present

## 2022-11-29 DIAGNOSIS — Z9181 History of falling: Secondary | ICD-10-CM | POA: Diagnosis not present

## 2022-11-29 DIAGNOSIS — I2510 Atherosclerotic heart disease of native coronary artery without angina pectoris: Secondary | ICD-10-CM | POA: Diagnosis not present

## 2022-12-05 DIAGNOSIS — R69 Illness, unspecified: Secondary | ICD-10-CM | POA: Diagnosis not present

## 2022-12-05 DIAGNOSIS — I10 Essential (primary) hypertension: Secondary | ICD-10-CM | POA: Diagnosis not present

## 2022-12-05 DIAGNOSIS — I2510 Atherosclerotic heart disease of native coronary artery without angina pectoris: Secondary | ICD-10-CM | POA: Diagnosis not present

## 2022-12-05 DIAGNOSIS — S81812D Laceration without foreign body, left lower leg, subsequent encounter: Secondary | ICD-10-CM | POA: Diagnosis not present

## 2022-12-05 DIAGNOSIS — Z7982 Long term (current) use of aspirin: Secondary | ICD-10-CM | POA: Diagnosis not present

## 2022-12-05 DIAGNOSIS — E785 Hyperlipidemia, unspecified: Secondary | ICD-10-CM | POA: Diagnosis not present

## 2022-12-05 DIAGNOSIS — Z9181 History of falling: Secondary | ICD-10-CM | POA: Diagnosis not present

## 2022-12-12 DIAGNOSIS — Z9181 History of falling: Secondary | ICD-10-CM | POA: Diagnosis not present

## 2022-12-12 DIAGNOSIS — E785 Hyperlipidemia, unspecified: Secondary | ICD-10-CM | POA: Diagnosis not present

## 2022-12-12 DIAGNOSIS — S81812D Laceration without foreign body, left lower leg, subsequent encounter: Secondary | ICD-10-CM | POA: Diagnosis not present

## 2022-12-12 DIAGNOSIS — I10 Essential (primary) hypertension: Secondary | ICD-10-CM | POA: Diagnosis not present

## 2022-12-12 DIAGNOSIS — Z7982 Long term (current) use of aspirin: Secondary | ICD-10-CM | POA: Diagnosis not present

## 2022-12-12 DIAGNOSIS — I2510 Atherosclerotic heart disease of native coronary artery without angina pectoris: Secondary | ICD-10-CM | POA: Diagnosis not present

## 2022-12-12 DIAGNOSIS — R69 Illness, unspecified: Secondary | ICD-10-CM | POA: Diagnosis not present

## 2022-12-21 DIAGNOSIS — Z7982 Long term (current) use of aspirin: Secondary | ICD-10-CM | POA: Diagnosis not present

## 2022-12-21 DIAGNOSIS — I2510 Atherosclerotic heart disease of native coronary artery without angina pectoris: Secondary | ICD-10-CM | POA: Diagnosis not present

## 2022-12-21 DIAGNOSIS — Z9181 History of falling: Secondary | ICD-10-CM | POA: Diagnosis not present

## 2022-12-21 DIAGNOSIS — S81812D Laceration without foreign body, left lower leg, subsequent encounter: Secondary | ICD-10-CM | POA: Diagnosis not present

## 2022-12-21 DIAGNOSIS — I10 Essential (primary) hypertension: Secondary | ICD-10-CM | POA: Diagnosis not present

## 2022-12-21 DIAGNOSIS — R69 Illness, unspecified: Secondary | ICD-10-CM | POA: Diagnosis not present

## 2022-12-21 DIAGNOSIS — E785 Hyperlipidemia, unspecified: Secondary | ICD-10-CM | POA: Diagnosis not present

## 2022-12-26 DIAGNOSIS — I10 Essential (primary) hypertension: Secondary | ICD-10-CM | POA: Diagnosis not present

## 2022-12-26 DIAGNOSIS — Z7982 Long term (current) use of aspirin: Secondary | ICD-10-CM | POA: Diagnosis not present

## 2022-12-26 DIAGNOSIS — R69 Illness, unspecified: Secondary | ICD-10-CM | POA: Diagnosis not present

## 2022-12-26 DIAGNOSIS — Z9181 History of falling: Secondary | ICD-10-CM | POA: Diagnosis not present

## 2022-12-26 DIAGNOSIS — S81812D Laceration without foreign body, left lower leg, subsequent encounter: Secondary | ICD-10-CM | POA: Diagnosis not present

## 2022-12-26 DIAGNOSIS — I2510 Atherosclerotic heart disease of native coronary artery without angina pectoris: Secondary | ICD-10-CM | POA: Diagnosis not present

## 2022-12-26 DIAGNOSIS — E785 Hyperlipidemia, unspecified: Secondary | ICD-10-CM | POA: Diagnosis not present

## 2022-12-28 ENCOUNTER — Encounter: Payer: Self-pay | Admitting: Internal Medicine

## 2022-12-28 ENCOUNTER — Ambulatory Visit: Payer: Medicare HMO | Attending: Internal Medicine | Admitting: Internal Medicine

## 2022-12-28 VITALS — BP 144/76 | HR 87 | Ht 63.0 in | Wt 141.0 lb

## 2022-12-28 DIAGNOSIS — I70213 Atherosclerosis of native arteries of extremities with intermittent claudication, bilateral legs: Secondary | ICD-10-CM

## 2022-12-28 DIAGNOSIS — I25118 Atherosclerotic heart disease of native coronary artery with other forms of angina pectoris: Secondary | ICD-10-CM | POA: Diagnosis not present

## 2022-12-28 DIAGNOSIS — E7849 Other hyperlipidemia: Secondary | ICD-10-CM | POA: Diagnosis not present

## 2022-12-28 DIAGNOSIS — Z720 Tobacco use: Secondary | ICD-10-CM

## 2022-12-28 DIAGNOSIS — I739 Peripheral vascular disease, unspecified: Secondary | ICD-10-CM

## 2022-12-28 DIAGNOSIS — I10 Essential (primary) hypertension: Secondary | ICD-10-CM

## 2022-12-28 MED ORDER — NITROGLYCERIN 0.4 MG SL SUBL: 0.4000 mg | SUBLINGUAL_TABLET | SUBLINGUAL | 3 refills | Status: AC | PRN

## 2022-12-28 NOTE — Patient Instructions (Signed)
Medication Instructions:  Your physician recommends that you continue on your current medications as directed. Please refer to the Current Medication list given to you today.  *If you need a refill on your cardiac medications before your next appointment, please call your pharmacy*   Lab Work: NONE   If you have labs (blood work) drawn today and your tests are completely normal, you will receive your results only by: [facility] (if you have MyChart) OR A paper copy in the mail If you have any lab test that is abnormal or we need to change your treatment, we will call you to review the results.   Testing/Procedures: Your physician has requested that you have an ankle brachial index (ABI). During this test an ultrasound and blood pressure cuff are used to evaluate the arteries that supply the arms and legs with blood. Allow thirty minutes for this exam. There are no restrictions or special instructions.    Follow-Up: At [facility], you and your health needs are our priority.  As part of our continuing mission to provide you with exceptional heart care, we have created designated Provider Care Teams.  These Care Teams include your primary Cardiologist (physician) and Advanced Practice Providers (APPs -  Physician Assistants and Nurse Practitioners) who all work together to provide you with the care you need, when you need it.  We recommend signing up for the patient portal called "MyChart".  Sign up information is provided on this After Visit Summary.  MyChart is used to connect with patients for Virtual Visits (Telemedicine).  Patients are able to view lab/test results, encounter notes, upcoming appointments, etc.  Non-urgent messages can be sent to your provider as well.   To learn more about what you can do with MyChart, go to [email or web address].    Your next appointment:   6 month(s)  Provider:   Claudina Lick, MD    Other Instructions Thank you for choosing  [facility]!

## 2022-12-30 DIAGNOSIS — I10 Essential (primary) hypertension: Secondary | ICD-10-CM | POA: Insufficient documentation

## 2022-12-30 DIAGNOSIS — I70213 Atherosclerosis of native arteries of extremities with intermittent claudication, bilateral legs: Secondary | ICD-10-CM | POA: Insufficient documentation

## 2022-12-30 DIAGNOSIS — I2510 Atherosclerotic heart disease of native coronary artery without angina pectoris: Secondary | ICD-10-CM | POA: Insufficient documentation

## 2022-12-30 DIAGNOSIS — E785 Hyperlipidemia, unspecified: Secondary | ICD-10-CM | POA: Insufficient documentation

## 2022-12-30 DIAGNOSIS — Z720 Tobacco use: Secondary | ICD-10-CM | POA: Insufficient documentation

## 2022-12-30 NOTE — Progress Notes (Signed)
Cardiology Office Note  Date: 12/30/2022   ID: Lauren Colon, DOB 11/05/1956, MRN [id number]  PCP:  Benetta Spar, MD  Cardiologist:  Little Ishikawa, MD Electrophysiologist:  None   Reason for Office Visit: Follow-up of CAD   History of Present Illness: Lauren Colon is a 67 y.o. female known to have moderate to severe CAD by CCTA in 11/2021 with normal LVEF, HTN, nicotine abuse presented to cardiology clinic for follow-up visit.  She was a prior patient of Dr. Purvis Sheffield. She was initially referred to cardiology clinic for chest pain in 2016. Nuclear stress test in 2016 showed a small defect of mild severity present in the basal to mid inferoseptal location, ischemia versus artifact.  She then underwent CCTA in 11/2021 which showed moderate to severe stenosis of LAD, mild to moderate stenosis of LCx and mild to moderate stenosis of RCA. FFR analysis was remarkable for possibly significant distal LAD stenosis and borderline distal LCx stenosis however the flow limitation was noted to be tapering throughout the vessel.  Clinical correlation with symptoms was advised at that time. Patient is here for follow-up visit.  She continues to have exertional chest pain that resolves with rest, once in 1 to 2 months and unable to recall the duration of the pain. Denies any SOB, lightheadedness, dizziness, syncope, palpitations, leg swelling but reported having pain/cramps in the right leg more than the left leg while walking and stops when she rests. Continues to smoke cigarettes and has been trying very hard to quit smoking.  Past Medical History:  Diagnosis Date   Arthritis    Fibromyalgia    Hypertension     Past Surgical History:  Procedure Laterality Date   TUBAL LIGATION      Current Outpatient Medications  Medication Sig Dispense Refill   aspirin 81 MG tablet Take 81 mg by mouth daily.     diclofenac (VOLTAREN) 50 MG EC tablet Take 1 tablet (50 mg total) by  mouth 2 (two) times daily. 60 tablet 2   ezetimibe (ZETIA) 10 MG tablet Take 1 tablet (10 mg total) by mouth daily. 90 tablet 3   ibuprofen (ADVIL) 600 MG tablet Take 1 tablet (600 mg total) by mouth every 6 (six) hours as needed. 30 tablet 0   losartan-hydrochlorothiazide (HYZAAR) 50-12.5 MG tablet Take 1 tablet by mouth daily.     nitroGLYCERIN (NITROSTAT) 0.4 MG SL tablet Place 1 tablet (0.4 mg total) under the tongue every 5 (five) minutes as needed for chest pain. 25 tablet 3   rosuvastatin (CRESTOR) 20 MG tablet Take 1 tablet (20 mg total) by mouth daily. 90 tablet 3   methocarbamol (ROBAXIN) 500 MG tablet Take 1 tablet (500 mg total) by mouth 2 (two) times daily. (Patient not taking: Reported on 12/28/2022) 20 tablet 0   traMADol (ULTRAM) 50 MG tablet Take 1 tablet (50 mg total) by mouth 3 (three) times daily as needed. (Patient not taking: Reported on 06/07/2022) 15 tablet 0   No current facility-administered medications for this visit.   Allergies:  Aspirin   Social History: The patient  reports that she has been smoking cigarettes. She has a 21.50 pack-year smoking history. She has never used smokeless tobacco. She reports that she does not drink alcohol and does not use drugs.   Family History: The patient's family history includes Breast cancer in her sister, sister, and sister; Heart attack in her brother; Heart disease in her brother.   ROS:  Please see  the history of present illness. Otherwise, complete review of systems is positive for none.  All other systems are reviewed and negative.   Physical Exam: VS:  BP (!) 144/76   Pulse 87   Ht 5\' 3"  (1.6 m)   Wt 141 lb (64 kg)   SpO2 95%   BMI 24.98 kg/m , BMI Body mass index is 24.98 kg/m.  Wt Readings from Last 3 Encounters:  12/28/22 141 lb (64 kg)  07/25/22 141 lb 8.6 oz (64.2 kg)  06/07/22 141 lb 9.6 oz (64.2 kg)    General: Patient appears comfortable at rest. HEENT: Conjunctiva and lids normal, oropharynx clear with  moist mucosa. Neck: Supple, no elevated JVP or carotid bruits, no thyromegaly. Lungs: Clear to auscultation, nonlabored breathing at rest. Cardiac: Regular rate and rhythm, no S3 or significant systolic murmur, no pericardial rub. Abdomen: Soft, nontender, no hepatomegaly, bowel sounds present, no guarding or rebound. Extremities: No pitting edema Skin: Warm and dry. Musculoskeletal: No kyphosis. Neuropsychiatric: Alert and oriented x3, affect grossly appropriate.  Recent Labwork: 06/07/2022: Magnesium 2.2 07/25/2022: BUN 28; Creatinine, Ser 0.80; Hemoglobin 13.0; Platelets 260; Potassium 3.6; Sodium 140     Component Value Date/Time   CHOL 105 08/30/2022 0804   TRIG 48 08/30/2022 0804   HDL 60 08/30/2022 0804   CHOLHDL 1.8 08/30/2022 0804   VLDL 10 08/30/2022 0804   LDLCALC 35 08/30/2022 0804   LDLDIRECT 112 (H) 01/19/2014 1519    Other Studies Reviewed Today: Echo in 12/2021 LVEF 55 to 60% No valvular abnormalities  CCTA in 11/2022 Moderate to severe stenosis of LAD, mild to moderate stenosis of LCx and mild to moderate stenosis of RCA. FFR analysis was remarkable for possibly significant distal LAD stenosis and borderline distal LCx stenosis however the flow limitation was noted to be tapering throughout the vessel.    Assessment and Plan: Patient is a 67 year old F known to have moderate to severe CAD by CCTA in 11/2021, HTN, nicotine abuse presented to cardiology clinic for follow-up visit.  # Bilateral intermittent lower extremity claudication, R more than L -Obtain ABI with USG arterial Doppler lower extremities  # Moderate to severe CAD by CCTA in 11/2021 (FFR showing possibly significant distal LAD disease and borderline LCx stenosis) with normal LVEF, CCS 1 angina -Patient has angina once in 1 to 2 months, will prescribe SL NTG 0.4 mg as needed. -Continue cardio prudent medications with aspirin 81 mg once daily, rosuvastatin 20 mg nightly and ezetimibe 10 mg once  daily. -ER precautions for chest pain  # HLD, currently at goal -Continue rosuvastatin 20 mg nightly and ezetimibe 10 mg once daily.  Goal LDL less than 70.  # HTN, controlled -Continue losartan-HCTZ 50-12.5 mg once daily -Check blood pressures at home, a.m. and p.m.  # Nicotine abuse -Smoking cessation counseling provided. Smoking cessation instruction/counseling given:  counseled patient on the dangers of tobacco use, advised patient to stop smoking, and reviewed strategies to maximize success   I have spent a total of 33 minutes with patient reviewing chart , EKGs, labs and examining patient as well as establishing an assessment and plan that was discussed with the patient.  > 50% of time was spent in direct patient care.     Medication Adjustments/Labs and Tests Ordered: Current medicines are reviewed at length with the patient today.  Concerns regarding medicines are outlined above.   Tests Ordered: Orders Placed This Encounter  Procedures   VAS [place] LOWER EXT ART SEG MULTI (SEGMENTALS &  LE RAYNAUDS)    Medication Changes: Meds ordered this encounter  Medications   nitroGLYCERIN (NITROSTAT) 0.4 MG SL tablet    Sig: Place 1 tablet (0.4 mg total) under the tongue every 5 (five) minutes as needed for chest pain.    Dispense:  25 tablet    Refill:  3    Disposition:  Follow up  6 months  Signed, Lauren Wessells Verne Spurr, MD, 12/30/2022 5:58 AM    [facility] [address] [place], [place] [address]

## 2023-01-02 ENCOUNTER — Other Ambulatory Visit: Payer: Self-pay | Admitting: Orthopedic Surgery

## 2023-01-05 ENCOUNTER — Other Ambulatory Visit: Payer: Self-pay | Admitting: Cardiology

## 2023-01-11 ENCOUNTER — Other Ambulatory Visit: Payer: Self-pay

## 2023-01-11 ENCOUNTER — Telehealth: Payer: Self-pay | Admitting: Internal Medicine

## 2023-01-11 DIAGNOSIS — I739 Peripheral vascular disease, unspecified: Secondary | ICD-10-CM

## 2023-01-11 NOTE — Telephone Encounter (Signed)
Patient called to schd her procedure but central scheduling are saying they can't do it. Saying the order is put in incorrectly. Can a new order be put in. Please advise

## 2023-01-11 NOTE — Telephone Encounter (Signed)
New order entered for ABI's. Please schedule

## 2023-01-17 ENCOUNTER — Other Ambulatory Visit (HOSPITAL_COMMUNITY): Payer: Medicare HMO

## 2023-01-25 ENCOUNTER — Ambulatory Visit (HOSPITAL_COMMUNITY): Payer: Medicare HMO

## 2023-01-25 ENCOUNTER — Encounter (HOSPITAL_COMMUNITY): Payer: Medicare HMO

## 2023-01-26 DIAGNOSIS — I10 Essential (primary) hypertension: Secondary | ICD-10-CM | POA: Diagnosis not present

## 2023-01-26 DIAGNOSIS — E782 Mixed hyperlipidemia: Secondary | ICD-10-CM | POA: Diagnosis not present

## 2023-01-29 ENCOUNTER — Ambulatory Visit (HOSPITAL_COMMUNITY)
Admission: RE | Admit: 2023-01-29 | Discharge: 2023-01-29 | Disposition: A | Discharge status: Home / Self Care | Payer: Medicare HMO | Source: Ambulatory Visit | Attending: Internal Medicine | Admitting: Internal Medicine

## 2023-01-29 DIAGNOSIS — I739 Peripheral vascular disease, unspecified: Secondary | ICD-10-CM | POA: Diagnosis not present

## 2023-01-29 DIAGNOSIS — I70213 Atherosclerosis of native arteries of extremities with intermittent claudication, bilateral legs: Secondary | ICD-10-CM | POA: Diagnosis not present

## 2023-02-01 ENCOUNTER — Telehealth: Payer: Self-pay

## 2023-02-01 NOTE — Telephone Encounter (Signed)
-----   Message from Chalmers Guest, MD sent at 02/01/2023  7:56 AM EST ----- No evidence of poor circulation in lower extremities

## 2023-02-01 NOTE — Telephone Encounter (Signed)
Patient notified and verbalized understanding. Patient had no questions or concerns at this time. PCP copied 

## 2023-02-24 DIAGNOSIS — I10 Essential (primary) hypertension: Secondary | ICD-10-CM | POA: Diagnosis not present

## 2023-02-24 DIAGNOSIS — E782 Mixed hyperlipidemia: Secondary | ICD-10-CM | POA: Diagnosis not present

## 2023-04-09 ENCOUNTER — Other Ambulatory Visit (HOSPITAL_COMMUNITY): Payer: Self-pay | Admitting: Gerontology

## 2023-04-09 ENCOUNTER — Encounter (HOSPITAL_COMMUNITY): Payer: Self-pay | Admitting: Gerontology

## 2023-04-09 DIAGNOSIS — M810 Age-related osteoporosis without current pathological fracture: Secondary | ICD-10-CM

## 2023-04-09 DIAGNOSIS — E782 Mixed hyperlipidemia: Secondary | ICD-10-CM | POA: Diagnosis not present

## 2023-04-09 DIAGNOSIS — F17210 Nicotine dependence, cigarettes, uncomplicated: Secondary | ICD-10-CM | POA: Diagnosis not present

## 2023-04-09 DIAGNOSIS — I25118 Atherosclerotic heart disease of native coronary artery with other forms of angina pectoris: Secondary | ICD-10-CM | POA: Diagnosis not present

## 2023-04-09 DIAGNOSIS — I10 Essential (primary) hypertension: Secondary | ICD-10-CM | POA: Diagnosis not present

## 2023-04-09 DIAGNOSIS — Z1389 Encounter for screening for other disorder: Secondary | ICD-10-CM | POA: Diagnosis not present

## 2023-04-09 DIAGNOSIS — R69 Illness, unspecified: Secondary | ICD-10-CM | POA: Diagnosis not present

## 2023-04-09 DIAGNOSIS — Z0001 Encounter for general adult medical examination with abnormal findings: Secondary | ICD-10-CM | POA: Diagnosis not present

## 2023-04-09 DIAGNOSIS — M1990 Unspecified osteoarthritis, unspecified site: Secondary | ICD-10-CM | POA: Diagnosis not present

## 2023-04-09 DIAGNOSIS — I70213 Atherosclerosis of native arteries of extremities with intermittent claudication, bilateral legs: Secondary | ICD-10-CM | POA: Diagnosis not present

## 2023-04-09 DIAGNOSIS — F17200 Nicotine dependence, unspecified, uncomplicated: Secondary | ICD-10-CM | POA: Diagnosis not present

## 2023-04-09 DIAGNOSIS — Z1331 Encounter for screening for depression: Secondary | ICD-10-CM | POA: Diagnosis not present

## 2023-04-19 ENCOUNTER — Ambulatory Visit (HOSPITAL_COMMUNITY)
Admission: RE | Admit: 2023-04-19 | Discharge: 2023-04-19 | Disposition: A | Discharge status: Home / Self Care | Payer: Medicare HMO | Source: Ambulatory Visit | Attending: Gerontology | Admitting: Gerontology

## 2023-04-19 DIAGNOSIS — M810 Age-related osteoporosis without current pathological fracture: Secondary | ICD-10-CM | POA: Insufficient documentation

## 2023-04-19 DIAGNOSIS — Z780 Asymptomatic menopausal state: Secondary | ICD-10-CM | POA: Diagnosis not present

## 2023-04-19 DIAGNOSIS — M8589 Other specified disorders of bone density and structure, multiple sites: Secondary | ICD-10-CM | POA: Diagnosis not present

## 2023-04-23 ENCOUNTER — Encounter: Payer: Self-pay | Admitting: Orthopedic Surgery

## 2023-04-23 ENCOUNTER — Ambulatory Visit: Payer: Medicare HMO | Admitting: Orthopedic Surgery

## 2023-04-23 DIAGNOSIS — M2142 Flat foot [pes planus] (acquired), left foot: Secondary | ICD-10-CM

## 2023-04-23 DIAGNOSIS — M5410 Radiculopathy, site unspecified: Secondary | ICD-10-CM

## 2023-04-23 DIAGNOSIS — M7061 Trochanteric bursitis, right hip: Secondary | ICD-10-CM

## 2023-04-23 MED ORDER — DICLOFENAC SODIUM 50 MG PO TBEC
DELAYED_RELEASE_TABLET | ORAL | 2 refills | Status: DC
Start: 2023-04-23 — End: 2023-07-16

## 2023-04-23 NOTE — Progress Notes (Signed)
Orthopaedic Clinic Return  Assessment: Lauren Colon is a 67 y.o. female with the following: Right greater trochanteric bursitis Right-sided radicular pain Left flatfoot   Plan: [name] has multiple complaints.  She has pain in the right leg.  She has some pain in the buttock, radiating pain into the right thigh.  She also has tenderness to palpation over the greater trochanter on the right.  She has some complaints about her left foot, with an inability to dorsiflex when she is trying to go upstairs.  Active function of the ankle and great toe is intact.  She has developed a flatfoot, which could be contributing to her issues.  I offered her therapy, and she is not interested.  She will continue with medicines as needed.  I provided her with a refill for diclofenac.  If interested in an injection, she will contact clinic.   Meds ordered this encounter  Medications   diclofenac (VOLTAREN) 50 MG EC tablet    Sig: TAKE 1 TABLET(50 MG) BY MOUTH TWICE DAILY    Dispense:  60 tablet    Refill:  2     Follow-up: Return if symptoms worsen or fail to improve.   Subjective:  Chief Complaint  Patient presents with   Follow-up    Right hip pain    History of Present Illness: Lauren Colon is a 68 y.o. female who returns to clinic for repeat evaluation of right hip pain.  I saw her close 2-year ago, for greater trochanteric bursitis.  She has been taking Voltaren since then.  This has been effective.  She continues to have pain in the lateral hip, as well as the right buttock.  She has some pain radiating into the right thigh.  She remains uninterested in an injection.  She recently had a DEXA bone scan, and is concerned about some of the findings.  She also notes that she has some reduced function of the left foot, especially when her shoes are off.  Review of Systems: No fevers or chills No numbness or tingling No chest pain No shortness of breath No bowel or bladder  dysfunction No GI distress No headaches   Objective: There were no vitals taken for this visit.  Physical Exam:  Alert and oriented.  No acute distress.  She walks with a nonantalgic gait.  She has tenderness palpation over the lateral hip.  Negative straight leg raise bilaterally.  She has good lower body strength.  She is developed a flatfoot on the left.  Intact dorsiflexion to the great toe, as well as the ankle.  Sensation is intact throughout bilateral lower extremities.  IMAGING: I personally ordered and reviewed the following images:  No new imaging obtained today.   Oliver Barre, MD 04/23/2023 11:03 AM

## 2023-05-09 DIAGNOSIS — E782 Mixed hyperlipidemia: Secondary | ICD-10-CM | POA: Diagnosis not present

## 2023-05-09 DIAGNOSIS — I10 Essential (primary) hypertension: Secondary | ICD-10-CM | POA: Diagnosis not present

## 2023-05-17 ENCOUNTER — Other Ambulatory Visit: Payer: Self-pay | Admitting: Cardiology

## 2023-06-08 DIAGNOSIS — I70209 Unspecified atherosclerosis of native arteries of extremities, unspecified extremity: Secondary | ICD-10-CM | POA: Diagnosis not present

## 2023-06-08 DIAGNOSIS — Z886 Allergy status to analgesic agent status: Secondary | ICD-10-CM | POA: Diagnosis not present

## 2023-06-08 DIAGNOSIS — I129 Hypertensive chronic kidney disease with stage 1 through stage 4 chronic kidney disease, or unspecified chronic kidney disease: Secondary | ICD-10-CM | POA: Diagnosis not present

## 2023-06-08 DIAGNOSIS — Z791 Long term (current) use of non-steroidal anti-inflammatories (NSAID): Secondary | ICD-10-CM | POA: Diagnosis not present

## 2023-06-08 DIAGNOSIS — I25119 Atherosclerotic heart disease of native coronary artery with unspecified angina pectoris: Secondary | ICD-10-CM | POA: Diagnosis not present

## 2023-06-08 DIAGNOSIS — M1990 Unspecified osteoarthritis, unspecified site: Secondary | ICD-10-CM | POA: Diagnosis not present

## 2023-06-08 DIAGNOSIS — E785 Hyperlipidemia, unspecified: Secondary | ICD-10-CM | POA: Diagnosis not present

## 2023-06-08 DIAGNOSIS — Z8249 Family history of ischemic heart disease and other diseases of the circulatory system: Secondary | ICD-10-CM | POA: Diagnosis not present

## 2023-06-08 DIAGNOSIS — N182 Chronic kidney disease, stage 2 (mild): Secondary | ICD-10-CM | POA: Diagnosis not present

## 2023-06-08 DIAGNOSIS — Z008 Encounter for other general examination: Secondary | ICD-10-CM | POA: Diagnosis not present

## 2023-06-08 DIAGNOSIS — Z833 Family history of diabetes mellitus: Secondary | ICD-10-CM | POA: Diagnosis not present

## 2023-06-08 DIAGNOSIS — Z803 Family history of malignant neoplasm of breast: Secondary | ICD-10-CM | POA: Diagnosis not present

## 2023-06-08 DIAGNOSIS — F17210 Nicotine dependence, cigarettes, uncomplicated: Secondary | ICD-10-CM | POA: Diagnosis not present

## 2023-06-09 DIAGNOSIS — I10 Essential (primary) hypertension: Secondary | ICD-10-CM | POA: Diagnosis not present

## 2023-06-09 DIAGNOSIS — E782 Mixed hyperlipidemia: Secondary | ICD-10-CM | POA: Diagnosis not present

## 2023-06-21 ENCOUNTER — Ambulatory Visit: Payer: Medicare HMO | Admitting: Internal Medicine

## 2023-06-28 ENCOUNTER — Ambulatory Visit: Payer: Medicare HMO | Admitting: Internal Medicine

## 2023-07-01 ENCOUNTER — Ambulatory Visit: Payer: Medicare HMO | Admitting: Internal Medicine

## 2023-07-08 ENCOUNTER — Ambulatory Visit: Payer: Medicare HMO | Admitting: Internal Medicine

## 2023-07-09 DIAGNOSIS — I10 Essential (primary) hypertension: Secondary | ICD-10-CM | POA: Diagnosis not present

## 2023-07-09 DIAGNOSIS — E782 Mixed hyperlipidemia: Secondary | ICD-10-CM | POA: Diagnosis not present

## 2023-07-16 ENCOUNTER — Telehealth: Payer: Self-pay | Admitting: Orthopedic Surgery

## 2023-08-02 ENCOUNTER — Ambulatory Visit: Payer: Medicare HMO | Admitting: Internal Medicine

## 2023-08-09 DIAGNOSIS — I10 Essential (primary) hypertension: Secondary | ICD-10-CM | POA: Diagnosis not present

## 2023-08-09 DIAGNOSIS — E782 Mixed hyperlipidemia: Secondary | ICD-10-CM | POA: Diagnosis not present

## 2023-08-21 ENCOUNTER — Ambulatory Visit: Payer: Medicare HMO | Attending: Internal Medicine | Admitting: Internal Medicine

## 2023-08-21 ENCOUNTER — Encounter: Payer: Self-pay | Admitting: Internal Medicine

## 2023-08-21 VITALS — BP 132/80 | HR 80 | Ht 64.0 in | Wt 141.0 lb

## 2023-08-21 DIAGNOSIS — I25118 Atherosclerotic heart disease of native coronary artery with other forms of angina pectoris: Secondary | ICD-10-CM | POA: Diagnosis not present

## 2023-08-21 NOTE — Progress Notes (Signed)
Cardiology Office Note  Date: 08/21/2023   ID: Lauren Colon, DOB April 14, 1956, MRN [id number]  PCP:  Benetta Spar, MD  Cardiologist:  Marjo Bicker, MD Electrophysiologist:  None    History of Present Illness: Lauren Colon is a 67 y.o. female known to have moderate to severe CAD by CCTA in 11/2021 with normal LVEF, HTN, nicotine abuse presented to cardiology clinic for follow-up visit.  She was a prior patient of Dr. Purvis Sheffield. She was initially referred to cardiology clinic for chest pain in 2016. Nuclear stress test in 2016 showed a small defect of mild severity present in the basal to mid inferoseptal location, ischemia versus artifact.  She then underwent CCTA in 11/2021 which showed moderate to severe stenosis of LAD, mild to moderate stenosis of LCx and mild to moderate stenosis of RCA. FFR analysis was remarkable for possibly significant distal LAD stenosis and borderline distal LCx stenosis however the flow limitation was noted to be tapering throughout the vessel.  Clinical correlation with symptoms was advised at that time.  ABI was within normal limits.  Patient is here for follow-up visit.    Overall doing great, did not have to take SL NTG in the last 6 months.  No interval angina.  No interval DOE, orthopnea, PND, leg swelling, dizziness, presyncope and syncope.  Continues to smoke cigarettes, not every day but is usually whenever she is stressed out.  Her great grandson reminds her not to smoke cigarettes.  Patient has pain bilaterally lower extremities but this pain does not go away with rest.  She thinks this could be from her hips.  Past Medical History:  Diagnosis Date   Arthritis    Fibromyalgia    Hypertension     Past Surgical History:  Procedure Laterality Date   TUBAL LIGATION      Current Outpatient Medications  Medication Sig Dispense Refill   aspirin 81 MG tablet Take 81 mg by mouth daily.     diclofenac (VOLTAREN) 50 MG EC tablet  TAKE 1 TABLET(50 MG) BY MOUTH TWICE DAILY 60 tablet 2   ezetimibe (ZETIA) 10 MG tablet TAKE 1 TABLET(10 MG) BY MOUTH DAILY 90 tablet 0   losartan-hydrochlorothiazide (HYZAAR) 50-12.5 MG tablet Take 1 tablet by mouth daily.     methocarbamol (ROBAXIN) 500 MG tablet Take 1 tablet (500 mg total) by mouth 2 (two) times daily. 20 tablet 0   nitroGLYCERIN (NITROSTAT) 0.4 MG SL tablet Place 1 tablet (0.4 mg total) under the tongue every 5 (five) minutes as needed for chest pain. 25 tablet 3   rosuvastatin (CRESTOR) 20 MG tablet TAKE 1 TABLET(20 MG) BY MOUTH DAILY 90 tablet 3   traMADol (ULTRAM) 50 MG tablet Take 1 tablet (50 mg total) by mouth 3 (three) times daily as needed. 15 tablet 0   No current facility-administered medications for this visit.   Allergies:  Aspirin   Social History: The patient  reports that she has been smoking cigarettes. She has a 21.5 pack-year smoking history. She has never used smokeless tobacco. She reports that she does not drink alcohol and does not use drugs.   Family History: The patient's family history includes Breast cancer in her sister, sister, and sister; Heart attack in her brother; Heart disease in her brother.   ROS:  Please see the history of present illness. Otherwise, complete review of systems is positive for none.  All other systems are reviewed and negative.   Physical Exam: VS:  BP  132/80 (BP Location: Right Arm, Patient Position: Sitting, Cuff Size: Normal)   Pulse 80   Ht 5\' 4"  (1.626 m)   Wt 141 lb (64 kg)   SpO2 98%   BMI 24.20 kg/m , BMI Body mass index is 24.2 kg/m.  Wt Readings from Last 3 Encounters:  08/21/23 141 lb (64 kg)  12/28/22 141 lb (64 kg)  07/25/22 141 lb 8.6 oz (64.2 kg)    General: Patient appears comfortable at rest. HEENT: Conjunctiva and lids normal, oropharynx clear with moist mucosa. Neck: Supple, no elevated JVP or carotid bruits, no thyromegaly. Lungs: Clear to auscultation, nonlabored breathing at  rest. Cardiac: Regular rate and rhythm, no S3 or significant systolic murmur, no pericardial rub. Abdomen: Soft, nontender, no hepatomegaly, bowel sounds present, no guarding or rebound. Extremities: No pitting edema Skin: Warm and dry. Musculoskeletal: No kyphosis. Neuropsychiatric: Alert and oriented x3, affect grossly appropriate.  Recent Labwork: No results found for requested labs within last 365 days.     Component Value Date/Time   CHOL 105 08/30/2022 0804   TRIG 48 08/30/2022 0804   HDL 60 08/30/2022 0804   CHOLHDL 1.8 08/30/2022 0804   VLDL 10 08/30/2022 0804   LDLCALC 35 08/30/2022 0804   LDLDIRECT 112 (H) 01/19/2014 1519    Other Studies Reviewed Today: Echo in 12/2021 LVEF 55 to 60% No valvular abnormalities  CCTA in 11/2022 Moderate to severe stenosis of LAD, mild to moderate stenosis of LCx and mild to moderate stenosis of RCA. FFR analysis was remarkable for possibly significant distal LAD stenosis and borderline distal LCx stenosis however the flow limitation was noted to be tapering throughout the vessel.    Assessment and Plan: Patient is a 67 year old F known to have moderate to severe CAD by CCTA in 11/2021, HTN, nicotine abuse presented to cardiology clinic for follow-up visit.  # Moderate to severe CAD by CCTA in 11/2021 (FFR showing possibly significant distal LAD disease and borderline LCx stenosis) with normal LVEF, angina free -Continue aspirin 81 mg once daily, rosuvastatin 20 mg nightly and Zetia 10 mg once daily. -SL NTG 0.4 mg as needed -ER precautions for chest pain  # HLD, currently at goal -Continue rosuvastatin 20 mg at bedtime, Zetia 10 mg once daily, goal LDL less than 70.  # HTN, controlled -Continue losartan-HCTZ 50-12.5 mg once daily.  # Nicotine abuse -Smokes 4 cigarettes a day and depending on her stress levels, smokes more.  Her great-grandson reminds her not to smoke cigarettes.  Smoking cessation counseling  provided.    Disposition:  Follow up 1 year  Signed, Juron Vorhees Verne Spurr, MD, 08/21/2023 10:49 AM    [facility] at [facility] [address] [place], [place] [address]

## 2023-08-21 NOTE — Patient Instructions (Signed)

## 2023-09-09 DIAGNOSIS — I10 Essential (primary) hypertension: Secondary | ICD-10-CM | POA: Diagnosis not present

## 2023-09-09 DIAGNOSIS — E782 Mixed hyperlipidemia: Secondary | ICD-10-CM | POA: Diagnosis not present

## 2023-09-12 ENCOUNTER — Other Ambulatory Visit (HOSPITAL_COMMUNITY): Payer: Self-pay | Admitting: Internal Medicine

## 2023-09-12 DIAGNOSIS — Z1231 Encounter for screening mammogram for malignant neoplasm of breast: Secondary | ICD-10-CM

## 2023-10-07 DIAGNOSIS — M1990 Unspecified osteoarthritis, unspecified site: Secondary | ICD-10-CM | POA: Diagnosis not present

## 2023-10-07 DIAGNOSIS — E782 Mixed hyperlipidemia: Secondary | ICD-10-CM | POA: Diagnosis not present

## 2023-10-07 DIAGNOSIS — F17200 Nicotine dependence, unspecified, uncomplicated: Secondary | ICD-10-CM | POA: Diagnosis not present

## 2023-10-07 DIAGNOSIS — I10 Essential (primary) hypertension: Secondary | ICD-10-CM | POA: Diagnosis not present

## 2023-10-11 ENCOUNTER — Telehealth: Payer: Self-pay | Admitting: Orthopaedic Surgery

## 2023-10-23 ENCOUNTER — Ambulatory Visit (HOSPITAL_COMMUNITY): Payer: Medicare HMO

## 2023-11-01 ENCOUNTER — Ambulatory Visit (HOSPITAL_COMMUNITY)
Admission: RE | Admit: 2023-11-01 | Discharge: 2023-11-01 | Disposition: A | Discharge status: Home / Self Care | Payer: Medicare HMO | Source: Ambulatory Visit | Attending: Internal Medicine | Admitting: Internal Medicine

## 2023-11-01 DIAGNOSIS — Z1231 Encounter for screening mammogram for malignant neoplasm of breast: Secondary | ICD-10-CM | POA: Insufficient documentation

## 2023-11-07 DIAGNOSIS — I10 Essential (primary) hypertension: Secondary | ICD-10-CM | POA: Diagnosis not present

## 2023-11-07 DIAGNOSIS — E782 Mixed hyperlipidemia: Secondary | ICD-10-CM | POA: Diagnosis not present

## 2023-12-07 DIAGNOSIS — E782 Mixed hyperlipidemia: Secondary | ICD-10-CM | POA: Diagnosis not present

## 2023-12-07 DIAGNOSIS — I10 Essential (primary) hypertension: Secondary | ICD-10-CM | POA: Diagnosis not present

## 2024-01-07 DIAGNOSIS — I10 Essential (primary) hypertension: Secondary | ICD-10-CM | POA: Diagnosis not present

## 2024-01-07 DIAGNOSIS — E782 Mixed hyperlipidemia: Secondary | ICD-10-CM | POA: Diagnosis not present

## 2024-01-21 ENCOUNTER — Encounter: Payer: Self-pay | Admitting: Orthopedic Surgery

## 2024-01-21 ENCOUNTER — Ambulatory Visit: Payer: Medicare HMO | Admitting: Orthopedic Surgery

## 2024-01-21 VITALS — BP 159/91 | HR 84 | Ht 64.0 in | Wt 138.0 lb

## 2024-01-21 DIAGNOSIS — M1611 Unilateral primary osteoarthritis, right hip: Secondary | ICD-10-CM

## 2024-01-21 MED ORDER — DICLOFENAC SODIUM 50 MG PO TBEC: 50.0000 mg | DELAYED_RELEASE_TABLET | Freq: Two times a day (BID) | ORAL | 0 refills | Status: DC

## 2024-01-21 NOTE — Addendum Note (Signed)
Addended by: Thane Edu A on: 01/21/2024 11:18 AM   Modules accepted: Orders

## 2024-01-21 NOTE — Patient Instructions (Signed)

## 2024-01-21 NOTE — Progress Notes (Signed)
 Orthopaedic Clinic Return  Assessment: Lauren Colon is a 68 y.o. female with the following: Right hip pain - moderate hip OA   Plan: Lauren Colon has pain in her right leg.  She is complaining of pain starting in her lower back, extending through her buttock.  She also has groin pain.  This pain radiates distally.  We reviewed radiographs from over a year ago, which demonstrates moderate degenerative changes of the right hip.  On physical exam, she has pain in the anterior hip and groin area with flexion to 90 degrees.  She also has a lot of pain with internal and external rotation.  It is possible that a lot of her pain is coming from the right hip.  This was discussed with the patient.  I recommended a steroid injection, and she elected to proceed.  She is not interested in surgery.  Procedure note injection - Right hip, ultrasound guidance   Verbal consent was obtained to inject the Right hip joint  Timeout was completed to confirm the site of injection.   Using the ultrasound, the femoral neck was identified.  The joint space was also identified. The skin was prepped with alcohol  and ethyl chloride was sprayed at the injection site.  A 21-gauge needle was used to inject 40 mg of Depo-Medrol and 1% lidocaine  (4 cc) into the hip joint of the Right hip using a direct anterior approach.  The needle was visualized entering the hip joint, and the medication was also visualized. There were no complications.  A sterile bandage was applied.   Note: In order to accurately identify the placement of the needle, ultrasound was required, to increase the accuracy, and specificity of the injection.     Follow-up: Return if symptoms worsen or fail to improve.   Subjective:  Chief Complaint  Patient presents with   Hip Pain    R states this pain is the worse it's been over the past 3 days.     History of Present Illness: Lauren Colon is a 68 y.o. female who returns to clinic for  repeat evaluation of right hip pain.  I have seen her multiple times.  She continues to have pain in the right hip.  It has gotten acutely worse.  She is having issues in the lower back and left buttock on the right side.  She also has pain in the anterior hip.  She has difficulty with driving even for short distances.  Her pain gets worse when she is seated.  It is affecting her sleep.  She has tried Tylenol  recently, and this is improved some of her pain.  Review of Systems: No fevers or chills No numbness or tingling No chest pain No shortness of breath No bowel or bladder dysfunction No GI distress No headaches   Objective: BP (!) 159/91   Pulse 84   Ht 5' 4 (1.626 m)   Wt 138 lb (62.6 kg)   BMI 23.69 kg/m   Physical Exam:  Alert and oriented.  No acute distress.  Right-sided antalgic gait.  She has pain in the anterior hip with flexion beyond 90 degrees.  Pain with internal and external rotation.  No tenderness palpation over the lateral hip.  Negative straight leg raise.  IMAGING: I personally ordered and reviewed the following images:  No new imaging obtained today.   Oneil DELENA Horde, MD 01/21/2024 11:14 AM

## 2024-01-22 ENCOUNTER — Other Ambulatory Visit: Payer: Self-pay | Admitting: Orthopaedic Surgery

## 2024-02-07 DIAGNOSIS — I10 Essential (primary) hypertension: Secondary | ICD-10-CM | POA: Diagnosis not present

## 2024-02-07 DIAGNOSIS — E782 Mixed hyperlipidemia: Secondary | ICD-10-CM | POA: Diagnosis not present

## 2024-02-11 ENCOUNTER — Other Ambulatory Visit: Payer: Self-pay | Admitting: Cardiology

## 2024-02-19 ENCOUNTER — Telehealth: Payer: Self-pay | Admitting: Orthopedic Surgery

## 2024-02-19 NOTE — Telephone Encounter (Signed)
 Dr. Dallas Schimke pt - spoke w/the pt, she is requesting a refill for Diclofenac 50mg , 60 tablets, 2 times daily to be sent to [facility] on [address]

## 2024-02-22 ENCOUNTER — Other Ambulatory Visit: Payer: Self-pay | Admitting: Orthopedic Surgery

## 2024-03-06 DIAGNOSIS — E782 Mixed hyperlipidemia: Secondary | ICD-10-CM | POA: Diagnosis not present

## 2024-03-06 DIAGNOSIS — I10 Essential (primary) hypertension: Secondary | ICD-10-CM | POA: Diagnosis not present

## 2024-03-19 DIAGNOSIS — F17210 Nicotine dependence, cigarettes, uncomplicated: Secondary | ICD-10-CM | POA: Diagnosis not present

## 2024-03-19 DIAGNOSIS — F17200 Nicotine dependence, unspecified, uncomplicated: Secondary | ICD-10-CM | POA: Diagnosis not present

## 2024-03-19 DIAGNOSIS — I25118 Atherosclerotic heart disease of native coronary artery with other forms of angina pectoris: Secondary | ICD-10-CM | POA: Diagnosis not present

## 2024-03-19 DIAGNOSIS — I70213 Atherosclerosis of native arteries of extremities with intermittent claudication, bilateral legs: Secondary | ICD-10-CM | POA: Diagnosis not present

## 2024-03-19 DIAGNOSIS — I10 Essential (primary) hypertension: Secondary | ICD-10-CM | POA: Diagnosis not present

## 2024-03-19 DIAGNOSIS — Z1389 Encounter for screening for other disorder: Secondary | ICD-10-CM | POA: Diagnosis not present

## 2024-03-19 DIAGNOSIS — Z1331 Encounter for screening for depression: Secondary | ICD-10-CM | POA: Diagnosis not present

## 2024-03-19 DIAGNOSIS — M1990 Unspecified osteoarthritis, unspecified site: Secondary | ICD-10-CM | POA: Diagnosis not present

## 2024-03-19 DIAGNOSIS — Z0001 Encounter for general adult medical examination with abnormal findings: Secondary | ICD-10-CM | POA: Diagnosis not present

## 2024-03-19 DIAGNOSIS — E782 Mixed hyperlipidemia: Secondary | ICD-10-CM | POA: Diagnosis not present

## 2024-03-19 DIAGNOSIS — Z1159 Encounter for screening for other viral diseases: Secondary | ICD-10-CM | POA: Diagnosis not present

## 2024-03-20 ENCOUNTER — Other Ambulatory Visit: Payer: Self-pay | Admitting: Orthopedic Surgery

## 2024-04-14 ENCOUNTER — Other Ambulatory Visit: Payer: Self-pay | Admitting: Orthopedic Surgery

## 2024-04-18 DIAGNOSIS — E782 Mixed hyperlipidemia: Secondary | ICD-10-CM | POA: Diagnosis not present

## 2024-04-18 DIAGNOSIS — I10 Essential (primary) hypertension: Secondary | ICD-10-CM | POA: Diagnosis not present

## 2024-05-16 ENCOUNTER — Other Ambulatory Visit: Payer: Self-pay | Admitting: Orthopedic Surgery

## 2024-05-19 DIAGNOSIS — I10 Essential (primary) hypertension: Secondary | ICD-10-CM | POA: Diagnosis not present

## 2024-05-19 DIAGNOSIS — E782 Mixed hyperlipidemia: Secondary | ICD-10-CM | POA: Diagnosis not present

## 2024-06-17 ENCOUNTER — Other Ambulatory Visit: Payer: Self-pay | Admitting: Orthopedic Surgery

## 2024-06-18 DIAGNOSIS — I10 Essential (primary) hypertension: Secondary | ICD-10-CM | POA: Diagnosis not present

## 2024-06-18 DIAGNOSIS — E782 Mixed hyperlipidemia: Secondary | ICD-10-CM | POA: Diagnosis not present

## 2024-07-17 ENCOUNTER — Other Ambulatory Visit: Payer: Self-pay | Admitting: Orthopedic Surgery

## 2024-07-19 DIAGNOSIS — E782 Mixed hyperlipidemia: Secondary | ICD-10-CM | POA: Diagnosis not present

## 2024-07-19 DIAGNOSIS — I10 Essential (primary) hypertension: Secondary | ICD-10-CM | POA: Diagnosis not present

## 2024-08-17 ENCOUNTER — Other Ambulatory Visit: Payer: Self-pay | Admitting: Orthopedic Surgery

## 2024-08-19 DIAGNOSIS — E782 Mixed hyperlipidemia: Secondary | ICD-10-CM | POA: Diagnosis not present

## 2024-08-19 DIAGNOSIS — I10 Essential (primary) hypertension: Secondary | ICD-10-CM | POA: Diagnosis not present

## 2024-09-15 ENCOUNTER — Other Ambulatory Visit: Payer: Self-pay | Admitting: Orthopedic Surgery

## 2024-09-21 DIAGNOSIS — M1990 Unspecified osteoarthritis, unspecified site: Secondary | ICD-10-CM | POA: Diagnosis not present

## 2024-09-21 DIAGNOSIS — E782 Mixed hyperlipidemia: Secondary | ICD-10-CM | POA: Diagnosis not present

## 2024-09-21 DIAGNOSIS — I10 Essential (primary) hypertension: Secondary | ICD-10-CM | POA: Diagnosis not present

## 2024-09-21 DIAGNOSIS — I70213 Atherosclerosis of native arteries of extremities with intermittent claudication, bilateral legs: Secondary | ICD-10-CM | POA: Diagnosis not present

## 2024-09-24 ENCOUNTER — Other Ambulatory Visit (HOSPITAL_COMMUNITY): Payer: Self-pay | Admitting: Internal Medicine

## 2024-09-24 DIAGNOSIS — Z1231 Encounter for screening mammogram for malignant neoplasm of breast: Secondary | ICD-10-CM

## 2024-09-30 ENCOUNTER — Ambulatory Visit (HOSPITAL_COMMUNITY)
Admission: RE | Admit: 2024-09-30 | Discharge: 2024-09-30 | Disposition: A | Discharge status: Home / Self Care | Source: Ambulatory Visit | Attending: Gerontology | Admitting: Gerontology

## 2024-09-30 ENCOUNTER — Other Ambulatory Visit (HOSPITAL_COMMUNITY): Payer: Self-pay | Admitting: Gerontology

## 2024-09-30 DIAGNOSIS — I10 Essential (primary) hypertension: Secondary | ICD-10-CM | POA: Diagnosis not present

## 2024-09-30 DIAGNOSIS — M79651 Pain in right thigh: Secondary | ICD-10-CM | POA: Diagnosis not present

## 2024-09-30 DIAGNOSIS — M25551 Pain in right hip: Secondary | ICD-10-CM | POA: Diagnosis not present

## 2024-09-30 DIAGNOSIS — M1611 Unilateral primary osteoarthritis, right hip: Secondary | ICD-10-CM | POA: Diagnosis not present

## 2024-09-30 DIAGNOSIS — M1990 Unspecified osteoarthritis, unspecified site: Secondary | ICD-10-CM | POA: Diagnosis not present

## 2024-10-16 ENCOUNTER — Other Ambulatory Visit: Payer: Self-pay | Admitting: Orthopedic Surgery

## 2024-10-31 DIAGNOSIS — E782 Mixed hyperlipidemia: Secondary | ICD-10-CM | POA: Diagnosis not present

## 2024-10-31 DIAGNOSIS — I10 Essential (primary) hypertension: Secondary | ICD-10-CM | POA: Diagnosis not present

## 2024-11-04 ENCOUNTER — Ambulatory Visit (HOSPITAL_COMMUNITY)
Admission: RE | Admit: 2024-11-04 | Discharge: 2024-11-04 | Disposition: A | Discharge status: Home / Self Care | Source: Ambulatory Visit | Attending: Internal Medicine | Admitting: Internal Medicine

## 2024-11-04 DIAGNOSIS — Z1231 Encounter for screening mammogram for malignant neoplasm of breast: Secondary | ICD-10-CM | POA: Diagnosis not present

## 2024-11-15 ENCOUNTER — Other Ambulatory Visit: Payer: Self-pay | Admitting: Orthopedic Surgery

## 2024-11-30 DIAGNOSIS — I10 Essential (primary) hypertension: Secondary | ICD-10-CM | POA: Diagnosis not present

## 2024-11-30 DIAGNOSIS — E782 Mixed hyperlipidemia: Secondary | ICD-10-CM | POA: Diagnosis not present

## 2024-12-15 ENCOUNTER — Other Ambulatory Visit: Payer: Self-pay | Admitting: Cardiology

## 2024-12-16 ENCOUNTER — Other Ambulatory Visit: Payer: Self-pay | Admitting: Orthopedic Surgery

## 2025-01-15 ENCOUNTER — Other Ambulatory Visit: Payer: Self-pay | Admitting: Orthopedic Surgery

## 2025-01-27 ENCOUNTER — Ambulatory Visit: Admitting: Internal Medicine
